# Patient Record
Sex: Male | Born: 1970 | Race: White | Hispanic: No | Marital: Single | Smoking: Current every day smoker
Health system: Southern US, Community
[De-identification: ages and names within clinical notes are randomized; demographics above are authoritative.]

## PROBLEM LIST (undated history)

## (undated) DIAGNOSIS — I2510 Atherosclerotic heart disease of native coronary artery without angina pectoris: Secondary | ICD-10-CM

## (undated) DIAGNOSIS — E785 Hyperlipidemia, unspecified: Secondary | ICD-10-CM

## (undated) DIAGNOSIS — Z955 Presence of coronary angioplasty implant and graft: Secondary | ICD-10-CM

## (undated) DIAGNOSIS — I255 Ischemic cardiomyopathy: Secondary | ICD-10-CM

## (undated) DIAGNOSIS — I10 Essential (primary) hypertension: Secondary | ICD-10-CM

## (undated) DIAGNOSIS — Z720 Tobacco use: Secondary | ICD-10-CM

## (undated) HISTORY — DX: Essential (primary) hypertension: I10

## (undated) HISTORY — DX: Tobacco use: Z72.0

## (undated) HISTORY — DX: Atherosclerotic heart disease of native coronary artery without angina pectoris: I25.10

## (undated) HISTORY — DX: Presence of coronary angioplasty implant and graft: Z95.5

## (undated) HISTORY — PX: HAND SURGERY: SHX662

## (undated) HISTORY — DX: Ischemic cardiomyopathy: I25.5

## (undated) HISTORY — DX: Hyperlipidemia, unspecified: E78.5

---

## 2002-01-23 ENCOUNTER — Emergency Department (HOSPITAL_COMMUNITY): Admission: EM | Admit: 2002-01-23 | Discharge: 2002-01-23 | Payer: Self-pay | Admitting: Emergency Medicine

## 2008-07-17 ENCOUNTER — Emergency Department (HOSPITAL_COMMUNITY): Admission: EM | Admit: 2008-07-17 | Discharge: 2008-07-17 | Payer: Self-pay | Admitting: Family Medicine

## 2008-11-20 ENCOUNTER — Ambulatory Visit (HOSPITAL_COMMUNITY): Admission: RE | Admit: 2008-11-20 | Discharge: 2008-11-20 | Payer: Self-pay | Admitting: Orthopedic Surgery
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
## 2010-05-04 LAB — CBC
MCHC: 34.3 g/dL (ref 30.0–36.0)
Platelets: 282 10*3/uL (ref 150–400)
RBC: 4.73 MIL/uL (ref 4.22–5.81)
RDW: 12 % (ref 11.5–15.5)

## 2018-05-13 ENCOUNTER — Other Ambulatory Visit: Payer: Self-pay

## 2018-05-13 ENCOUNTER — Emergency Department (HOSPITAL_COMMUNITY)
Admission: EM | Admit: 2018-05-13 | Discharge: 2018-05-13 | Disposition: A | Discharge status: Home / Self Care | Payer: Self-pay | Attending: Emergency Medicine | Admitting: Emergency Medicine

## 2018-05-13 ENCOUNTER — Encounter (HOSPITAL_COMMUNITY): Payer: Self-pay | Admitting: Emergency Medicine

## 2018-05-13 ENCOUNTER — Emergency Department (HOSPITAL_COMMUNITY): Payer: Self-pay

## 2018-05-13 DIAGNOSIS — J069 Acute upper respiratory infection, unspecified: Secondary | ICD-10-CM | POA: Insufficient documentation

## 2018-05-13 DIAGNOSIS — F17200 Nicotine dependence, unspecified, uncomplicated: Secondary | ICD-10-CM | POA: Insufficient documentation

## 2018-05-13 NOTE — ED Provider Notes (Signed)
[facility] EMERGENCY DEPARTMENT Provider Note   CSN: [id number] Arrival date & time: 05/13/18  0735    History   Chief Complaint Chief Complaint  Patient presents with  . Cough    HPI Haywood Lassohillip D Wolaver is a 48 y.o. male.     HPI Patient states he has had subjective fever and sweats starting last night.  Has had a cough for the last 2 days.  Is nonproductive.  Endorses generalized fatigue.  No difficulty breathing or chest pain.  No known COVID contacts.  His girlfriend is a Engineer, civil (consulting)nurse but she is asymptomatic.  No recent travel. History reviewed. No pertinent past medical history.  There are no active problems to display for this patient.   Past Surgical History:  Procedure Laterality Date  . HAND SURGERY          Home Medications    Prior to Admission medications   Not on File    Family History History reviewed. No pertinent family history.  Social History Social History   Tobacco Use  . Smoking status: Current Every Day Smoker    Packs/day: 1.00  . Smokeless tobacco: Never Used  Substance Use Topics  . Alcohol use: Never    Frequency: Never  . Drug use: Never     Allergies   Patient has no allergy information on record.   Review of Systems Review of Systems  Constitutional: Positive for diaphoresis and fever.  HENT: Negative for congestion and sore throat.   Respiratory: Positive for cough.   Gastrointestinal: Negative for diarrhea, nausea and vomiting.  Musculoskeletal: Positive for myalgias. Negative for neck pain and neck stiffness.  Skin: Negative for rash.  Neurological: Negative for dizziness, weakness, light-headedness, numbness and headaches.  All other systems reviewed and are negative.    Physical Exam Updated Vital Signs BP (!) 140/108   Pulse 96   Temp 98.3 F (36.8 C)   Resp 17   Ht 5\' 7"  (1.702 m)   SpO2 97%   Physical Exam Vitals signs and nursing note reviewed.  Constitutional:      General: He is not in acute  distress.    Appearance: Normal appearance. He is well-developed. He is not ill-appearing.  HENT:     Head: Normocephalic and atraumatic.  Eyes:     Pupils: Pupils are equal, round, and reactive to light.  Neck:     Musculoskeletal: Normal range of motion and neck supple.  Cardiovascular:     Rate and Rhythm: Normal rate.  Pulmonary:     Effort: Pulmonary effort is normal.     Breath sounds: Normal breath sounds.  Abdominal:     Palpations: Abdomen is soft.  Musculoskeletal: Normal range of motion.        General: No tenderness.  Skin:    General: Skin is warm and dry.     Findings: No erythema or rash.  Neurological:     General: No focal deficit present.     Mental Status: He is alert and oriented to person, place, and time.  Psychiatric:        Behavior: Behavior normal.      ED Treatments / Results  Labs (all labs ordered are listed, but only abnormal results are displayed) Labs Reviewed - No data to display  EKG None  [facility]  Result Date: 05/13/2018 CLINICAL DATA:  48 year old male with cough and fever EXAM: PORTABLE CHEST 1 VIEW COMPARISON:  None. FINDINGS: Cardiomediastinal silhouette within normal  limits in size and contour. Low lung volumes. No confluent airspace disease, pneumothorax, or pleural effusion. No displaced fracture IMPRESSION: Negative for acute cardiopulmonary disease Electronically Signed   By: Gilmer Mor D.O.   On: 05/13/2018 08:30    Procedures Procedures (including critical care time)  Medications Ordered in ED Medications - No data to display   Initial Impression / Assessment and Plan / ED Course  I have reviewed the triage vital signs and the nursing notes.  Pertinent labs & imaging results that were available during my care of the patient were reviewed by me and considered in my medical decision making (see chart for details).       ARTHUR MICKLEY was evaluated in Emergency Department on 05/13/2018 for  the symptoms described in the history of present illness. He was evaluated in the context of the global COVID-19 pandemic, which necessitated consideration that the patient might be at risk for infection with the SARS-CoV-2 virus that causes COVID-19. Institutional protocols and algorithms that pertain to the evaluation of patients at risk for COVID-19 are in a state of rapid change based on information released by regulatory bodies including the CDC and federal and state organizations. These policies and algorithms were followed during the patient's care in the ED.  Patient is very well-appearing.  No respiratory difficulty.  He has normal vital signs.  He is afebrile.  Chest x-ray without evidence of pneumonia.  Have advised self quarantine at home.  Understands the need to return for worsening of his symptoms especially difficulty breathing.  Final Clinical Impressions(s) / ED Diagnoses   Final diagnoses:  Viral upper respiratory tract infection    ED Discharge Orders    None       Loren Racer, MD 05/13/18 [phone]

## 2018-05-13 NOTE — ED Notes (Signed)
Obtained signed quarantine paper and gave quarantine instructions to patient. Patient verbalized understanding.

## 2018-05-13 NOTE — ED Triage Notes (Addendum)
Pt states having cough and fever since last night. Unknown of how high fever was.  Pt states his girlfriend is a Engineer, civil (consulting) at [employer] and did surgery on a "covid patient" and wants to be tested

## 2018-05-13 NOTE — Discharge Instructions (Addendum)
° ° °  Person Under Monitoring Name: Tanner Mcmillan  Location: [address] [place] [address]     Stay home except to get medical care You should restrict activities outside your home, except for getting medical care. Do not go to work, school, or public areas, and do not use public transportation or taxis.  Call ahead before visiting your doctor Before your medical appointment, call the healthcare provider and tell them that you have, or are being evaluated for, COVID-19 infection. This will help the healthcare providers office take steps to keep other people from getting infected. Ask your healthcare provider to call the local or state health department.  Monitor your symptoms Seek prompt medical attention if your illness is worsening (e.g., difficulty breathing). Before going to your medical appointment, call the healthcare provider and tell them that you have, or are being evaluated for, COVID-19 infection. Ask your healthcare provider to call the local or state health department.  Wear a facemask You should wear a facemask that covers your nose and mouth when you are in the same room with other people and when you visit a healthcare provider. People who live with or visit you should also wear a facemask while they are in the same room with you.  Separate yourself from other people in your home As much as possible, you should stay in a different room from other people in your home. Also, you should use a separate bathroom, if available.  Avoid sharing household items You should not share dishes, drinking glasses, cups, eating utensils, towels, bedding, or other items with other people in your home. After using these items, you should wash them thoroughly with soap and water.  Cover your coughs and sneezes Cover your mouth and nose with a tissue when you cough or sneeze, or you can cough or sneeze into your sleeve. Throw used tissues in a lined trash can, and  immediately wash your hands with soap and water for at least 20 seconds or use an alcohol-based hand rub.  Wash your [employer] your hands often and thoroughly with soap and water for at least 20 seconds. You can use an alcohol-based hand sanitizer if soap and water are not available and if your hands are not visibly dirty. Avoid touching your eyes, nose, and mouth with unwashed hands.

## 2023-08-03 ENCOUNTER — Other Ambulatory Visit: Payer: Self-pay

## 2023-08-03 ENCOUNTER — Emergency Department (HOSPITAL_COMMUNITY): Admit: 2023-08-03 | Payer: Self-pay | Admitting: Cardiovascular Disease

## 2023-08-03 ENCOUNTER — Encounter (HOSPITAL_COMMUNITY)
Admission: EM | Disposition: A | Discharge status: Home / Self Care | Payer: Self-pay | Source: Home / Self Care | Attending: Cardiovascular Disease

## 2023-08-03 ENCOUNTER — Emergency Department (HOSPITAL_COMMUNITY): Payer: Self-pay

## 2023-08-03 ENCOUNTER — Inpatient Hospital Stay (HOSPITAL_COMMUNITY): Payer: Self-pay

## 2023-08-03 ENCOUNTER — Inpatient Hospital Stay (HOSPITAL_COMMUNITY)
Admission: EM | Admit: 2023-08-03 | Discharge: 2023-08-05 | DRG: 321 | Disposition: A | Discharge status: Home / Self Care | Payer: Self-pay | Attending: Cardiovascular Disease | Admitting: Cardiovascular Disease

## 2023-08-03 DIAGNOSIS — Z888 Allergy status to other drugs, medicaments and biological substances status: Secondary | ICD-10-CM | POA: Diagnosis not present

## 2023-08-03 DIAGNOSIS — I10 Essential (primary) hypertension: Secondary | ICD-10-CM | POA: Diagnosis not present

## 2023-08-03 DIAGNOSIS — I2510 Atherosclerotic heart disease of native coronary artery without angina pectoris: Secondary | ICD-10-CM

## 2023-08-03 DIAGNOSIS — Z955 Presence of coronary angioplasty implant and graft: Secondary | ICD-10-CM

## 2023-08-03 DIAGNOSIS — I213 ST elevation (STEMI) myocardial infarction of unspecified site: Secondary | ICD-10-CM | POA: Diagnosis not present

## 2023-08-03 DIAGNOSIS — J9811 Atelectasis: Secondary | ICD-10-CM | POA: Diagnosis not present

## 2023-08-03 DIAGNOSIS — R000 Tachycardia, unspecified: Secondary | ICD-10-CM | POA: Diagnosis not present

## 2023-08-03 DIAGNOSIS — Z79899 Other long term (current) drug therapy: Secondary | ICD-10-CM | POA: Diagnosis not present

## 2023-08-03 DIAGNOSIS — R079 Chest pain, unspecified: Secondary | ICD-10-CM | POA: Diagnosis not present

## 2023-08-03 DIAGNOSIS — F17210 Nicotine dependence, cigarettes, uncomplicated: Secondary | ICD-10-CM | POA: Diagnosis not present

## 2023-08-03 DIAGNOSIS — I2109 ST elevation (STEMI) myocardial infarction involving other coronary artery of anterior wall: Principal | ICD-10-CM

## 2023-08-03 DIAGNOSIS — I2111 ST elevation (STEMI) myocardial infarction involving right coronary artery: Secondary | ICD-10-CM | POA: Diagnosis not present

## 2023-08-03 DIAGNOSIS — I255 Ischemic cardiomyopathy: Secondary | ICD-10-CM | POA: Diagnosis not present

## 2023-08-03 HISTORY — PX: LEFT HEART CATH AND CORONARY ANGIOGRAPHY: CATH118249

## 2023-08-03 HISTORY — PX: CORONARY/GRAFT ACUTE MI REVASCULARIZATION: CATH118305

## 2023-08-03 LAB — POCT I-STAT, CHEM 8
BUN: 9 mg/dL (ref 6–20)
Calcium, Ion: 1.08 mmol/L — ABNORMAL LOW (ref 1.15–1.40)
Chloride: 105 mmol/L (ref 98–111)
Creatinine, Ser: 0.7 mg/dL (ref 0.61–1.24)
Glucose, Bld: 114 mg/dL — ABNORMAL HIGH (ref 70–99)
HCT: 42 % (ref 39.0–52.0)
Hemoglobin: 14.3 g/dL (ref 13.0–17.0)
Potassium: 3.3 mmol/L — ABNORMAL LOW (ref 3.5–5.1)
Sodium: 140 mmol/L (ref 135–145)
TCO2: 21 mmol/L — ABNORMAL LOW (ref 22–32)

## 2023-08-03 LAB — LIPID PANEL
Cholesterol: 192 mg/dL (ref 0–200)
HDL: 32 mg/dL — ABNORMAL LOW (ref >40)
LDL Cholesterol: 129 mg/dL — ABNORMAL HIGH (ref 0–99)
Total CHOL/HDL Ratio: 6 ratio
Triglycerides: 156 mg/dL — ABNORMAL HIGH (ref <150)
VLDL: 31 mg/dL (ref 0–40)

## 2023-08-03 LAB — CBC WITH DIFFERENTIAL/PLATELET
Abs Immature Granulocytes: 0.03 K/uL (ref 0.00–0.07)
Basophils Absolute: 0.1 K/uL (ref 0.0–0.1)
Basophils Relative: 1 %
Eosinophils Absolute: 0.3 K/uL (ref 0.0–0.5)
Eosinophils Relative: 2 %
HCT: 46.6 % (ref 39.0–52.0)
Hemoglobin: 15.6 g/dL (ref 13.0–17.0)
Immature Granulocytes: 0 %
Lymphocytes Relative: 19 %
Lymphs Abs: 2.4 K/uL (ref 0.7–4.0)
MCH: 31.9 pg (ref 26.0–34.0)
MCHC: 33.5 g/dL (ref 30.0–36.0)
MCV: 95.3 fL (ref 80.0–100.0)
Monocytes Absolute: 0.8 K/uL (ref 0.1–1.0)
Monocytes Relative: 6 %
Neutro Abs: 9.3 K/uL — ABNORMAL HIGH (ref 1.7–7.7)
Neutrophils Relative %: 72 %
Platelets: 340 K/uL (ref 150–400)
RBC: 4.89 MIL/uL (ref 4.22–5.81)
RDW: 13.2 % (ref 11.5–15.5)
WBC: 12.9 K/uL — ABNORMAL HIGH (ref 4.0–10.5)
nRBC: 0 % (ref 0.0–0.2)

## 2023-08-03 LAB — COMPREHENSIVE METABOLIC PANEL WITH GFR
ALT: 23 U/L (ref 0–44)
AST: 19 U/L (ref 15–41)
Albumin: 3.6 g/dL (ref 3.5–5.0)
Alkaline Phosphatase: 76 U/L (ref 38–126)
Anion gap: 14 (ref 5–15)
BUN: 10 mg/dL (ref 6–20)
CO2: 21 mmol/L — ABNORMAL LOW (ref 22–32)
Calcium: 8.8 mg/dL — ABNORMAL LOW (ref 8.9–10.3)
Chloride: 103 mmol/L (ref 98–111)
Creatinine, Ser: 0.84 mg/dL (ref 0.61–1.24)
GFR, Estimated: >60 mL/min (ref >60)
Glucose, Bld: 173 mg/dL — ABNORMAL HIGH (ref 70–99)
Potassium: 3.7 mmol/L (ref 3.5–5.1)
Sodium: 138 mmol/L (ref 135–145)
Total Bilirubin: <0.2 mg/dL (ref 0.0–1.2)
Total Protein: 6.5 g/dL (ref 6.5–8.1)

## 2023-08-03 LAB — BASIC METABOLIC PANEL WITH GFR
Anion gap: 11 (ref 5–15)
BUN: 8 mg/dL (ref 6–20)
CO2: 23 mmol/L (ref 22–32)
Calcium: 9.7 mg/dL (ref 8.9–10.3)
Chloride: 105 mmol/L (ref 98–111)
Creatinine, Ser: 0.71 mg/dL (ref 0.61–1.24)
GFR, Estimated: >60 mL/min (ref >60)
Glucose, Bld: 91 mg/dL (ref 70–99)
Potassium: 4.3 mmol/L (ref 3.5–5.1)
Sodium: 139 mmol/L (ref 135–145)

## 2023-08-03 LAB — POCT ACTIVATED CLOTTING TIME
Activated Clotting Time: 256 s
Activated Clotting Time: 435 s

## 2023-08-03 LAB — SURGICAL PCR SCREEN
MRSA, PCR: NEGATIVE
Staphylococcus aureus: NEGATIVE

## 2023-08-03 LAB — ECHOCARDIOGRAM COMPLETE
Area-P 1/2: 3.91 cm2
Height: 66 in
S' Lateral: 3.75 cm
Weight: 2720 [oz_av]

## 2023-08-03 LAB — MAGNESIUM: Magnesium: 2 mg/dL (ref 1.7–2.4)

## 2023-08-03 LAB — APTT: aPTT: 35 s (ref 24–36)

## 2023-08-03 LAB — PROTIME-INR
INR: 0.9 (ref 0.8–1.2)
Prothrombin Time: 13.2 s (ref 11.4–15.2)

## 2023-08-03 LAB — GLUCOSE, CAPILLARY
Glucose-Capillary: 112 mg/dL — ABNORMAL HIGH (ref 70–99)
Glucose-Capillary: 95 mg/dL (ref 70–99)

## 2023-08-03 LAB — I-STAT CG4 LACTIC ACID, ED: Lactic Acid, Venous: 2 mmol/L (ref 0.5–1.9)

## 2023-08-03 LAB — HEMOGLOBIN A1C
Hgb A1c MFr Bld: 6.2 % — ABNORMAL HIGH (ref 4.8–5.6)
Mean Plasma Glucose: 131.24 mg/dL

## 2023-08-03 LAB — TROPONIN I (HIGH SENSITIVITY)
Troponin I (High Sensitivity): 25 ng/L — ABNORMAL HIGH (ref <18)
Troponin I (High Sensitivity): 3839 ng/L (ref <18)

## 2023-08-03 SURGERY — CORONARY/GRAFT ACUTE MI REVASCULARIZATION
Anesthesia: Moderate Sedation

## 2023-08-03 MED ORDER — POTASSIUM CHLORIDE CRYS ER 20 MEQ PO TBCR
20.0000 meq | EXTENDED_RELEASE_TABLET | Freq: Once | ORAL | Status: AC
  Administered 2023-08-03: 20 meq via ORAL
  Filled 2023-08-03: qty 1

## 2023-08-03 MED ORDER — VERAPAMIL HCL 2.5 MG/ML IV SOLN
INTRAVENOUS | Status: DC | PRN
  Administered 2023-08-03: 10 mL via INTRA_ARTERIAL

## 2023-08-03 MED ORDER — ACETAMINOPHEN 325 MG PO TABS
650.0000 mg | ORAL_TABLET | ORAL | Status: DC | PRN
  Administered 2023-08-04 (×2): 650 mg via ORAL
  Filled 2023-08-03 (×2): qty 2

## 2023-08-03 MED ORDER — SODIUM CHLORIDE 0.9 % IV SOLN: INTRAVENOUS | Status: AC

## 2023-08-03 MED ORDER — SODIUM CHLORIDE 0.9 % IV SOLN
250.0000 mL | INTRAVENOUS | Status: AC | PRN
Start: 2023-08-03 — End: 2023-08-04

## 2023-08-03 MED ORDER — ATORVASTATIN CALCIUM 80 MG PO TABS
80.0000 mg | ORAL_TABLET | Freq: Every day | ORAL | Status: DC
  Administered 2023-08-03 – 2023-08-05 (×3): 80 mg via ORAL
  Filled 2023-08-03 (×3): qty 1

## 2023-08-03 MED ORDER — SODIUM CHLORIDE 0.9% FLUSH
3.0000 mL | Freq: Two times a day (BID) | INTRAVENOUS | Status: DC
  Administered 2023-08-03 – 2023-08-05 (×4): 3 mL via INTRAVENOUS

## 2023-08-03 MED ORDER — ONDANSETRON HCL 4 MG/2ML IJ SOLN: 4.0000 mg | Freq: Four times a day (QID) | INTRAMUSCULAR | Status: DC | PRN

## 2023-08-03 MED ORDER — MUPIROCIN 2 % EX OINT
1.0000 | TOPICAL_OINTMENT | Freq: Two times a day (BID) | CUTANEOUS | Status: DC
  Administered 2023-08-03 (×2): 1 via NASAL
  Filled 2023-08-03: qty 22

## 2023-08-03 MED ORDER — LIDOCAINE HCL (PF) 1 % IJ SOLN
INTRAMUSCULAR | Status: DC | PRN
  Administered 2023-08-03: 2 mL

## 2023-08-03 MED ORDER — MIDAZOLAM HCL 2 MG/2ML IJ SOLN
INTRAMUSCULAR | Status: AC
Start: 2023-08-03 — End: 2023-08-03
  Filled 2023-08-03: qty 2

## 2023-08-03 MED ORDER — OXYCODONE HCL 5 MG PO TABS: 5.0000 mg | ORAL_TABLET | ORAL | Status: DC | PRN

## 2023-08-03 MED ORDER — HEPARIN (PORCINE) IN NACL 1000-0.9 UT/500ML-% IV SOLN
INTRAVENOUS | Status: DC | PRN
  Administered 2023-08-03: 500 mL

## 2023-08-03 MED ORDER — TICAGRELOR 90 MG PO TABS
ORAL_TABLET | ORAL | Status: DC | PRN
  Administered 2023-08-03: 180 mg via ORAL

## 2023-08-03 MED ORDER — SODIUM CHLORIDE 0.9% FLUSH
3.0000 mL | INTRAVENOUS | Status: DC | PRN
Start: 2023-08-03 — End: 2023-08-06

## 2023-08-03 MED ORDER — LABETALOL HCL 5 MG/ML IV SOLN: 10.0000 mg | INTRAVENOUS | Status: AC | PRN

## 2023-08-03 MED ORDER — ASPIRIN 81 MG PO CHEW
324.0000 mg | CHEWABLE_TABLET | Freq: Once | ORAL | Status: DC
  Filled 2023-08-03: qty 4

## 2023-08-03 MED ORDER — TIROFIBAN HCL IN NACL 5-0.9 MG/100ML-% IV SOLN: 0.1500 ug/kg/min | INTRAVENOUS | Status: AC

## 2023-08-03 MED ORDER — HEPARIN SODIUM (PORCINE) 1000 UNIT/ML IJ SOLN
INTRAMUSCULAR | Status: AC
  Filled 2023-08-03: qty 10

## 2023-08-03 MED ORDER — TIROFIBAN (AGGRASTAT) BOLUS VIA INFUSION
INTRAVENOUS | Status: DC | PRN
  Administered 2023-08-03: 1927.5 ug via INTRAVENOUS

## 2023-08-03 MED ORDER — TICAGRELOR 90 MG PO TABS
90.0000 mg | ORAL_TABLET | Freq: Two times a day (BID) | ORAL | Status: DC
  Administered 2023-08-03 – 2023-08-05 (×4): 90 mg via ORAL
  Filled 2023-08-03 (×4): qty 1

## 2023-08-03 MED ORDER — MORPHINE SULFATE (PF) 2 MG/ML IV SOLN: 2.0000 mg | INTRAVENOUS | Status: DC | PRN

## 2023-08-03 MED ORDER — ORAL CARE MOUTH RINSE: 15.0000 mL | OROMUCOSAL | Status: DC | PRN

## 2023-08-03 MED ORDER — VERAPAMIL HCL 2.5 MG/ML IV SOLN
INTRAVENOUS | Status: AC
Start: 2023-08-03 — End: 2023-08-03
  Filled 2023-08-03: qty 2

## 2023-08-03 MED ORDER — HYDRALAZINE HCL 20 MG/ML IJ SOLN
10.0000 mg | INTRAMUSCULAR | Status: AC | PRN
Start: 2023-08-03 — End: 2023-08-03

## 2023-08-03 MED ORDER — LIDOCAINE HCL (PF) 1 % IJ SOLN
INTRAMUSCULAR | Status: AC
  Filled 2023-08-03: qty 30

## 2023-08-03 MED ORDER — TIROFIBAN HCL IN NACL 5-0.9 MG/100ML-% IV SOLN
INTRAVENOUS | Status: AC | PRN
  Administered 2023-08-03: .15 ug/kg/min via INTRAVENOUS

## 2023-08-03 MED ORDER — FENTANYL CITRATE (PF) 100 MCG/2ML IJ SOLN
INTRAMUSCULAR | Status: DC | PRN
  Administered 2023-08-03: 25 ug via INTRAVENOUS
  Administered 2023-08-03: 50 ug via INTRAVENOUS

## 2023-08-03 MED ORDER — MIDAZOLAM HCL 2 MG/2ML IJ SOLN
INTRAMUSCULAR | Status: DC | PRN
  Administered 2023-08-03 (×2): 1 mg via INTRAVENOUS

## 2023-08-03 MED ORDER — HEPARIN SODIUM (PORCINE) 5000 UNIT/ML IJ SOLN
INTRAMUSCULAR | Status: AC
  Filled 2023-08-03: qty 1

## 2023-08-03 MED ORDER — ASPIRIN 81 MG PO CHEW
81.0000 mg | CHEWABLE_TABLET | Freq: Every day | ORAL | Status: DC
  Administered 2023-08-04 – 2023-08-05 (×2): 81 mg via ORAL
  Filled 2023-08-03 (×2): qty 1

## 2023-08-03 MED ORDER — CHLORHEXIDINE GLUCONATE CLOTH 2 % EX PADS
6.0000 | MEDICATED_PAD | Freq: Every day | CUTANEOUS | Status: DC
  Administered 2023-08-04 – 2023-08-05 (×2): 6 via TOPICAL

## 2023-08-03 MED ORDER — HEPARIN SODIUM (PORCINE) 5000 UNIT/ML IJ SOLN
4000.0000 [IU] | Freq: Once | INTRAMUSCULAR | Status: AC
  Administered 2023-08-03: 4000 [IU] via INTRAVENOUS

## 2023-08-03 MED ORDER — HEPARIN SODIUM (PORCINE) 1000 UNIT/ML IJ SOLN
INTRAMUSCULAR | Status: DC | PRN
  Administered 2023-08-03: 3000 [IU] via INTRAVENOUS
  Administered 2023-08-03: 6000 [IU] via INTRAVENOUS

## 2023-08-03 MED ORDER — NICOTINE 14 MG/24HR TD PT24
14.0000 mg | MEDICATED_PATCH | Freq: Every day | TRANSDERMAL | Status: DC
  Administered 2023-08-03 – 2023-08-04 (×2): 14 mg via TRANSDERMAL
  Filled 2023-08-03 (×2): qty 1

## 2023-08-03 MED ORDER — FENTANYL CITRATE (PF) 100 MCG/2ML IJ SOLN
INTRAMUSCULAR | Status: AC
Start: 2023-08-03 — End: 2023-08-03
  Filled 2023-08-03: qty 2

## 2023-08-03 SURGICAL SUPPLY — 15 items
BALLOON SAPPHIRE 2.5X12 (BALLOONS) IMPLANT
BALLOON ~~LOC~~ EMERGE MR 3.0X20 (BALLOONS) IMPLANT
BALLOON ~~LOC~~ EMERGE MR 3.5X15 (BALLOONS) IMPLANT
CATH 5FR JL3.5 JR4 ANG PIG MP (CATHETERS) IMPLANT
CATH VISTA GUIDE 6FR JR4 ECOPK (CATHETERS) IMPLANT
DEVICE RAD COMP TR BAND LRG (VASCULAR PRODUCTS) IMPLANT
GLIDESHEATH SLEND SS 6F .021 (SHEATH) IMPLANT
GUIDEWIRE INQWIRE 1.5J.035X260 (WIRE) IMPLANT
KIT ENCORE 26 ADVANTAGE (KITS) IMPLANT
PACK CARDIAC CATHETERIZATION (CUSTOM PROCEDURE TRAY) ×1 IMPLANT
SET ATX-X65L (MISCELLANEOUS) IMPLANT
STATION PROTECTION PRESSURIZED (MISCELLANEOUS) IMPLANT
STENT SYNERGY XD 2.75X24 (Permanent Stent) IMPLANT
STENT SYNERGY XD 3.0X24 (Permanent Stent) IMPLANT
WIRE ASAHI PROWATER 180CM (WIRE) IMPLANT

## 2023-08-03 NOTE — ED Triage Notes (Addendum)
 Pt bib EMS from home for chest pain beginning around 9:30. Pt was washing dishing when pain started. Ems gave 1 nitroglycerin  and 324 of aspirin  with relief, 2L O2. Hx of smoking. EMS activated Code Stemi. ST elevation noted on EKG  400 NS bolus given with EMS 91% Room Air

## 2023-08-03 NOTE — ED Provider Notes (Signed)
 [facility] CARDIAC CATH LAB Provider Note   CSN: [id number] Arrival date & time: 08/03/23  1054     Patient presents with: Chest Pain   Tanner Mcmillan is a 53 y.o. male.   Pt is a 53 yo male with pmhx significant for tobacco abuse.  He has not been to the doctor in years.  Pt developed cp today around 0930 while washing dishes.  Pt does have ST elevation on EKG, so EMS activated a code stemi en route.  Pt was given asa and nitroglycerin  en route.  Pt still has CP.       Prior to Admission medications   Not on File    Allergies: Allegra-d allergy & congestion [fexofenadine-pseudoephed er]    Review of Systems  Cardiovascular:  Positive for chest pain.  All other systems reviewed and are negative.   Updated Vital Signs BP (!) 150/84   Temp 98.3 F (36.8 C) (Oral)   Resp (!) 21   Ht 5' 6 (1.676 m)   Wt 77.1 kg   SpO2 100%   BMI 27.44 kg/m   Physical Exam Vitals and nursing note reviewed.  Constitutional:      Appearance: He is well-developed.  HENT:     Head: Normocephalic and atraumatic.  Eyes:     Extraocular Movements: Extraocular movements intact.     Pupils: Pupils are equal, round, and reactive to light.  Cardiovascular:     Rate and Rhythm: Normal rate and regular rhythm.     Heart sounds: Normal heart sounds.  Pulmonary:     Effort: Pulmonary effort is normal.     Breath sounds: Normal breath sounds.  Abdominal:     General: Bowel sounds are normal.     Palpations: Abdomen is soft.  Musculoskeletal:        General: Normal range of motion.     Cervical back: Normal range of motion and neck supple.  Skin:    General: Skin is warm.     Capillary Refill: Capillary refill takes less than 2 seconds.  Neurological:     General: No focal deficit present.     Mental Status: He is alert and oriented to person, place, and time.  Psychiatric:        Mood and Affect: Mood normal.        Behavior: Behavior normal.     (all labs  ordered are listed, but only abnormal results are displayed) Labs Reviewed  CBC WITH DIFFERENTIAL/PLATELET - Abnormal; Notable for the following components:      Result Value   WBC 12.9 (*)    Neutro Abs 9.3 (*)    All other components within normal limits  I-STAT CG4 LACTIC ACID, ED - Abnormal; Notable for the following components:   Lactic Acid, Venous 2.0 (*)    All other components within normal limits  HEMOGLOBIN A1C  PROTIME-INR  APTT  COMPREHENSIVE METABOLIC PANEL WITH GFR  LIPID PANEL  TROPONIN I (HIGH SENSITIVITY)    EKG: EKG Interpretation Date/Time:  Saturday August 03 2023 10:57:14 EDT Ventricular Rate:  103 PR Interval:  147 QRS Duration:  113 QT Interval:  342 QTC Calculation: 448 R Axis:   37  Text Interpretation: Sinus tachycardia Probable left atrial enlargement Incomplete right bundle branch block Lateral infarct, acute No old tracing to compare Confirmed by Dean Clarity [phone]) on 08/03/2023 11:28:41 AM  Radiology: No results found.   Procedures   Medications Ordered in the ED  0.9 %  sodium chloride  infusion (has no administration in time range)  aspirin  chewable tablet 324 mg ( Oral MAR Hold 08/03/23 1113)  heparin  5000 UNIT/ML injection (has no administration in time range)  midazolam  (VERSED ) injection (1 mg Intravenous Given 08/03/23 1122)  fentaNYL  (SUBLIMAZE ) injection (25 mcg Intravenous Given 08/03/23 1122)  lidocaine  (PF) (XYLOCAINE ) 1 % injection (2 mLs Infiltration Given 08/03/23 1123)  Radial Cocktail/Verapamil  only (10 mLs Intra-arterial Given 08/03/23 1123)  Heparin  (Porcine) in NaCl 1000-0.9 UT/500ML-% SOLN (500 mLs  Given 08/03/23 1128)  heparin  sodium (porcine) injection (6,000 Units Intravenous Given 08/03/23 1123)  heparin  injection 4,000 Units (4,000 Units Intravenous Given 08/03/23 1106)                                    Medical Decision Making Amount and/or Complexity of Data Reviewed Labs: ordered.  Risk OTC drugs. Prescription drug  management.   This patient presents to the ED for concern of cp, this involves an extensive number of treatment options, and is a complaint that carries with it a high risk of complications and morbidity.  The differential diagnosis includes stemi, nstemi, pulm, gi   Co morbidities that complicate the patient evaluation  Tobacco abuse   Additional history obtained:  Additional history obtained from epic chart review External records from outside source obtained and reviewed including EMS report   Lab Tests:  I Ordered, and personally interpreted labs.  The pertinent results include:  cbc with wbc sl elevated at 12.9 (other labs pending at tx to cath lab)   Imaging Studies ordered:  I ordered a CXR, but pt went to the cath lab prior to it getting done.  Cardiac Monitoring:  The patient was maintained on a cardiac monitor.  I personally viewed and interpreted the cardiac monitored which showed an underlying rhythm of: nsr   Medicines ordered and prescription drug management:  I ordered medication including heparin   for stemi  Reevaluation of the patient after these medicines showed that the patient improved I have reviewed the patients home medicines and have made adjustments as needed   Critical Interventions:  Code stemi   Consultations Obtained:  I requested consultation with the cardiologist (Dr. Verlin),  and discussed lab and imaging findings as well as pertinent plan - he took pt to the cath lab   Problem List / ED Course:  STEMI:  pt given asa by EMS.  He is given heparin  in the ED.  He is transferred to the cath lab   Reevaluation:  After the interventions noted above, I reevaluated the patient and found that they have :improved   Social Determinants of Health:  Lives at home   Dispostion:  After consideration of the diagnostic results and the patients response to treatment, I feel that the patent would benefit from admission.       Final  diagnoses:  ST elevation myocardial infarction (STEMI), unspecified artery [facility])    ED Discharge Orders     None          Dean Clarity, MD 08/03/23 1129

## 2023-08-03 NOTE — Progress Notes (Signed)
*  PRELIMINARY RESULTS* Echocardiogram 2D Echocardiogram has been performed.  Tanner Mcmillan 08/03/2023, 2:37 PM

## 2023-08-03 NOTE — Progress Notes (Signed)
   08/03/23 1108  Spiritual Encounters  Type of Visit Initial  Reason for visit Code  OnCall Visit Yes   Chaplain responded to Code STEMI page. Per doctor, Pt is going to [facility] for treatment and his mother may be coming but that's uncertain. Chaplain asked ER nurse at head station to contact chaplain if mother arrives. No spiritual need at this time.  Chaplain Therisa Samuel

## 2023-08-03 NOTE — H&P (Signed)
 Cardiology Admission History and Physical   Patient ID: Tanner Mcmillan MRN: [id number]; DOB: 22-Dec-1970   Admission date: 08/03/2023  PCP:  Patient, No Pcp Per   [facility] Providers Cardiologist:  None   Chief Complaint:  Chest pain   History of Present Illness: Tanner Mcmillan  is a 53 yo male with history of tobacco abuse who is presenting via EMS with active chest pain that began at 9:30am. EKG with lateral ST elevation c/w acute MI. No prior cardiac issues. Has smoked 1 ppd for 35 years.    No past medical history on file. Past Surgical History:  Procedure Laterality Date   HAND SURGERY       Medications Prior to Admission: Prior to Admission medications   Not on File     Allergies:    Allergies  Allergen Reactions   Allegra-D Allergy & Congestion [Fexofenadine-Pseudoephed Er]     Social History:   Social History   Socioeconomic History   Marital status: Single    Spouse name: Not on file   Number of children: Not on file   Years of education: Not on file   Highest education level: Not on file  Occupational History   Not on file  Tobacco Use   Smoking status: Every Day    Current packs/day: 1.00    Types: Cigarettes   Smokeless tobacco: Never  Substance and Sexual Activity   Alcohol use: Never   Drug use: Never   Sexual activity: Not on file  Other Topics Concern   Not on file  Social History Narrative   Not on file   Social Drivers of Health   Financial Resource Strain: Not on file  Food Insecurity: Not on file  Transportation Needs: Not on file  Physical Activity: Not on file  Stress: Not on file  Social Connections: Not on file  Intimate Partner Violence: Not on file     Family History:   The patient's family history is not on file.   No CAD  ROS:  Please see the history of present illness.  All other ROS reviewed and negative.     Physical Exam/Data: Vitals:   08/03/23 1058 08/03/23 1101  BP: (!) 150/84   Resp: (!)  21   Temp: 98.3 F (36.8 C)   TempSrc: Oral   SpO2: 100%   Weight:  77.1 kg  Height:  5' 6 (1.676 m)   No intake or output data in the 24 hours ending 08/03/23 1107    08/03/2023   11:01 AM  Last 3 Weights  Weight (lbs) 170 lb  Weight (kg) 77.111 kg     Body mass index is 27.44 kg/m.  General:  Well nourished, well developed, in no acute distress HEENT: normal Neck: no JVD Vascular: No carotid bruits; Distal pulses 2+ bilaterally   Cardiac:  normal S1, S2; RRR; no murmur  Lungs:  clear to auscultation bilaterally, no wheezing, rhonchi or rales  Abd: soft, nontender, no hepatomegaly  Ext: no LE edema Musculoskeletal:  No deformities, BUE and BLE strength normal and equal Skin: warm and dry  Neuro:  CNs 2-12 intact, no focal abnormalities noted Psych:  Normal affect   EKG:  The ECG that was done was personally reviewed and demonstrates sinus with 1 mm elevation in lead 1, AVL and V2 with ST depression inferior leads  Relevant CV Studies:   Laboratory Data: High Sensitivity Troponin:  No results for input(s): TROPONINIHS in the last 720 hours.  ChemistryNo results for input(s): NA, K, CL, CO2, GLUCOSE, BUN, CREATININE, CALCIUM , MG, GFRNONAA, GFRAA, ANIONGAP in the last 168 hours.  No results for input(s): PROT, ALBUMIN, AST, ALT, ALKPHOS, BILITOT in the last 168 hours. Lipids No results for input(s): CHOL, TRIG, HDL, LABVLDL, LDLCALC, CHOLHDL in the last 168 hours. HematologyNo results for input(s): WBC, RBC, HGB, HCT, MCV, MCH, MCHC, RDW, PLT in the last 168 hours. Thyroid No results for input(s): TSH, FREET4 in the last 168 hours. BNPNo results for input(s): BNP, PROBNP in the last 168 hours.  DDimer No results for input(s): DDIMER in the last 168 hours.  Radiology/Studies:  No results found.   Assessment and Plan:  Acute anterolateral STEMI: EKG c/w acute MI. Plan emergent cardiac cath.    Code Status: Full Code  Severity of Illness: The appropriate patient status for this patient is INPATIENT. Inpatient status is judged to be reasonable and necessary in order to provide the required intensity of service to ensure the patient's safety. The patient's presenting symptoms, physical exam findings, and initial radiographic and laboratory data in the context of their chronic comorbidities is felt to place them at high risk for further clinical deterioration. Furthermore, it is not anticipated that the patient will be medically stable for discharge from the hospital within 2 midnights of admission.   * I certify that at the point of admission it is my clinical judgment that the patient will require inpatient hospital care spanning beyond 2 midnights from the point of admission due to high intensity of service, high risk for further deterioration and high frequency of surveillance required.*  For questions or updates, please contact [facility] Please consult [email or web address] for contact info under     Signed, Lonni Cash, MD  08/03/2023 11:07 AM

## 2023-08-04 DIAGNOSIS — I2109 ST elevation (STEMI) myocardial infarction involving other coronary artery of anterior wall: Principal | ICD-10-CM

## 2023-08-04 LAB — BASIC METABOLIC PANEL WITH GFR
Anion gap: 10 (ref 5–15)
BUN: 10 mg/dL (ref 6–20)
CO2: 26 mmol/L (ref 22–32)
Calcium: 9.5 mg/dL (ref 8.9–10.3)
Chloride: 103 mmol/L (ref 98–111)
Creatinine, Ser: 0.76 mg/dL (ref 0.61–1.24)
GFR, Estimated: >60 mL/min (ref >60)
Glucose, Bld: 111 mg/dL — ABNORMAL HIGH (ref 70–99)
Potassium: 4.2 mmol/L (ref 3.5–5.1)
Sodium: 139 mmol/L (ref 135–145)

## 2023-08-04 LAB — CBC
HCT: 48.2 % (ref 39.0–52.0)
Hemoglobin: 16.3 g/dL (ref 13.0–17.0)
MCH: 32.1 pg (ref 26.0–34.0)
MCHC: 33.8 g/dL (ref 30.0–36.0)
MCV: 95.1 fL (ref 80.0–100.0)
Platelets: 347 K/uL (ref 150–400)
RBC: 5.07 MIL/uL (ref 4.22–5.81)
RDW: 13.2 % (ref 11.5–15.5)
WBC: 12.6 K/uL — ABNORMAL HIGH (ref 4.0–10.5)
nRBC: 0 % (ref 0.0–0.2)

## 2023-08-04 LAB — GLUCOSE, CAPILLARY: Glucose-Capillary: 141 mg/dL — ABNORMAL HIGH (ref 70–99)

## 2023-08-04 MED ORDER — LOSARTAN POTASSIUM 25 MG PO TABS
25.0000 mg | ORAL_TABLET | Freq: Every day | ORAL | Status: DC
  Administered 2023-08-04 – 2023-08-05 (×2): 25 mg via ORAL
  Filled 2023-08-04 (×2): qty 1

## 2023-08-04 MED ORDER — ENOXAPARIN SODIUM 40 MG/0.4ML IJ SOSY
40.0000 mg | PREFILLED_SYRINGE | INTRAMUSCULAR | Status: DC
  Administered 2023-08-04: 40 mg via SUBCUTANEOUS
  Filled 2023-08-04: qty 0.4

## 2023-08-04 NOTE — Progress Notes (Addendum)
 Patient ID: Tanner Mcmillan, male   DOB: 26-Aug-1970, 53 y.o.   MRN: [id number]     Advanced Heart Failure Rounding Note  Cardiologist: None  Chief Complaint: STEMI Subjective:    No further chest pain.    Echo: EF 50-55%, moderate LVH, RV normal.    Objective:   Weight Range: 79.3 kg Body mass index is 28.22 kg/m.   Vital Signs:   Temp:  [97.7 F (36.5 C)-98.3 F (36.8 C)] 97.7 F (36.5 C) (07/06 0612) Pulse Rate:  [0-102] 93 (07/06 0800) Resp:  [11-24] 15 (07/06 0800) BP: (113-150)/(77-111) 129/77 (07/06 0800) SpO2:  [91 %-100 %] 92 % (07/06 0800) Weight:  [77.1 kg-79.3 kg] 79.3 kg (07/06 0500) Last BM Date : 08/03/23  Weight change: Filed Weights   08/03/23 1101 08/04/23 0500  Weight: 77.1 kg 79.3 kg    Intake/Output:   Intake/Output Summary (Last 24 hours) at 08/04/2023 0838 Last data filed at 08/04/2023 0800 Gross per 24 hour  Intake 1119.18 ml  Output 2800 ml  Net -1680.82 ml      Physical Exam    General:  Well appearing. No resp difficulty HEENT: Normal Neck: Supple. JVP . Carotids 2+ bilat; no bruits. No lymphadenopathy or thyromegaly appreciated. Cor: PMI nondisplaced. Regular rate & rhythm. No rubs, gallops or murmurs. Lungs: Clear Abdomen: Soft, nontender, nondistended. No hepatosplenomegaly. No bruits or masses. Good bowel sounds. Extremities: No cyanosis, clubbing, rash, edema Neuro: Alert & orientedx3, cranial nerves grossly intact. moves all 4 extremities w/o difficulty. Affect pleasant   Telemetry   NSR 90s-100s (personally reviewed)  EKG    NSR with lateral TWIs (personally reviewed)  Labs    CBC Recent Labs    08/03/23 1059 08/03/23 1135 08/04/23 0232  WBC 12.9*  --  12.6*  NEUTROABS 9.3*  --   --   HGB 15.6 14.3 16.3  HCT 46.6 42.0 48.2  MCV 95.3  --  95.1  PLT 340  --  347   Basic Metabolic Panel Recent Labs    92/94/74 1905 08/04/23 0232  NA 139 139  K 4.3 4.2  CL 105 103  CO2 23 26  GLUCOSE 91 111*  BUN 8  10  CREATININE 0.71 0.76  CALCIUM  9.7 9.5  MG 2.0  --    Liver Function Tests Recent Labs    08/03/23 1059  AST 19  ALT 23  ALKPHOS 76  BILITOT <0.2  PROT 6.5  ALBUMIN 3.6   No results for input(s): LIPASE, AMYLASE in the last 72 hours. Cardiac Enzymes No results for input(s): CKTOTAL, CKMB, CKMBINDEX, TROPONINI in the last 72 hours.  BNP: BNP (last 3 results) No results for input(s): BNP in the last 8760 hours.  ProBNP (last 3 results) No results for input(s): PROBNP in the last 8760 hours.   D-Dimer No results for input(s): DDIMER in the last 72 hours. Hemoglobin A1C Recent Labs    08/03/23 1059  HGBA1C 6.2*   Fasting Lipid Panel Recent Labs    08/03/23 1059  CHOL 192  HDL 32*  LDLCALC 129*  TRIG 156*  CHOLHDL 6.0   Thyroid Function Tests No results for input(s): TSH, T4TOTAL, T3FREE, THYROIDAB in the last 72 hours.  Invalid input(s): FREET3  Other results:   Imaging    ECHOCARDIOGRAM COMPLETE Result Date: 08/03/2023    ECHOCARDIOGRAM REPORT   Patient Name:   Tanner Mcmillan Date of Exam: 08/03/2023 Medical Rec #:  [id number]  Height:       66.0 in Accession #:    [id number]       Weight:       170.0 lb Date of Birth:  01-Mar-1970        BSA:          1.866 m Patient Age:    53 years         BP:           150/84 mmHg Patient Gender: M                HR:           80 bpm. Exam Location:  [facility] Procedure: 2D Echo, 3D Echo, Cardiac Doppler, Color Doppler and Strain Analysis            (Both Spectral and Color Flow Doppler were utilized during            procedure). Indications:    CAD Native Vessel l25.10  History:        Patient has no prior history of Echocardiogram examinations.                 This admission with acute NSTEMI.  Sonographer:    Aida Pizza RCS Referring Phys: [phone] [facility] D [facility]  Sonographer Comments: Global longitudinal strain was attempted. IMPRESSIONS  1. Left ventricular ejection fraction, by  estimation, is 50 to 55%. Left ventricular ejection fraction by 3D volume is 50 %. The left ventricle has low normal function. The left ventricle demonstrates regional wall motion abnormalities (see scoring diagram/findings for description). There is moderate concentric left ventricular hypertrophy. Left ventricular diastolic parameters are indeterminate.  2. Right ventricular systolic function is normal. The right ventricular size is normal. Tricuspid regurgitation signal is inadequate for assessing PA pressure.  3. The mitral valve is grossly normal. Trivial mitral valve regurgitation.  4. The aortic valve is tricuspid. Aortic valve regurgitation is not visualized.  5. The inferior vena cava is normal in size with greater than 50% respiratory variability, suggesting right atrial pressure of 3 mmHg. Comparison(s): No prior Echocardiogram. FINDINGS  Left Ventricle: Left ventricular ejection fraction, by estimation, is 50 to 55%. Left ventricular ejection fraction by 3D volume is 50 %. The left ventricle has low normal function. The left ventricle demonstrates regional wall motion abnormalities. The  left ventricular internal cavity size was normal in size. There is moderate concentric left ventricular hypertrophy. Left ventricular diastolic parameters are indeterminate.  LV Wall Scoring: The basal inferolateral segment, basal anterolateral segment, and basal inferior segment are hypokinetic. The entire anterior wall, mid and distal lateral wall, entire septum, entire apex, mid and distal inferior wall, and mid anterolateral segment are normal. Right Ventricle: The right ventricular size is normal. No increase in right ventricular wall thickness. Right ventricular systolic function is normal. Tricuspid regurgitation signal is inadequate for assessing PA pressure. Left Atrium: Left atrial size was normal in size. Right Atrium: Right atrial size was normal in size. Pericardium: Trivial pericardial effusion is present.  The pericardial effusion is posterior to the left ventricle. Mitral Valve: The mitral valve is grossly normal. Trivial mitral valve regurgitation. Tricuspid Valve: The tricuspid valve is grossly normal. Tricuspid valve regurgitation is trivial. Aortic Valve: The aortic valve is tricuspid. Aortic valve regurgitation is not visualized. Pulmonic Valve: The pulmonic valve was grossly normal. Pulmonic valve regurgitation is not visualized. Aorta: The aortic root is normal in size and structure. Venous: The inferior vena cava is normal in  size with greater than 50% respiratory variability, suggesting right atrial pressure of 3 mmHg. IAS/Shunts: No atrial level shunt detected by color flow Doppler. Additional Comments: 3D was performed not requiring image post processing on an independent workstation and was normal.  LEFT VENTRICLE PLAX 2D LVIDd:         5.15 cm         Diastology LVIDs:         3.75 cm         LV e' medial:    7.46 cm/s LV PW:         1.30 cm         LV E/e' medial:  11.1 LV IVS:        1.30 cm         LV e' lateral:   8.08 cm/s LVOT diam:     2.00 cm         LV E/e' lateral: 10.2 LV SV:         60 LV SV Index:   32 LVOT Area:     3.14 cm        3D Volume EF                                LV 3D EF:    Left                                             ventricul                                             ar                                             ejection                                             fraction                                             by 3D                                             volume is                                             50 %.                                 3D Volume EF:  3D EF:        50 %                                LV EDV:       157 ml                                LV ESV:       78 ml                                LV SV:        79 ml RIGHT VENTRICLE RV S prime:     10.40 cm/s TAPSE (M-mode): 1.6 cm LEFT ATRIUM             Index         RIGHT ATRIUM           Index LA diam:        3.90 cm 2.09 cm/m   RA Area:     11.10 cm LA Vol (A2C):   43.0 ml 23.04 ml/m  RA Volume:   25.00 ml  13.40 ml/m LA Vol (A4C):   44.8 ml 24.00 ml/m LA Biplane Vol: 44.5 ml 23.84 ml/m  AORTIC VALVE LVOT Vmax:   98.80 cm/s LVOT Vmean:  71.000 cm/s LVOT VTI:    0.190 m  AORTA Ao Root diam: 3.70 cm MITRAL VALVE MV Area (PHT): 3.91 cm    SHUNTS MV Decel Time: 194 msec    Systemic VTI:  0.19 m MV E velocity: 82.50 cm/s  Systemic Diam: 2.00 cm MV A velocity: 91.70 cm/s MV E/A ratio:  0.90 Jayson Sierras MD Electronically signed by Jayson Sierras MD Signature Date/Time: 08/03/2023/3:12:08 PM    Final    CARDIAC CATHETERIZATION Result Date: 08/03/2023   Mid RCA lesion is 99% stenosed.   Dist RCA lesion is 70% stenosed.   1st Mrg lesion is 40% stenosed.   Mid LAD lesion is 70% stenosed.   1st Diag lesion is 50% stenosed.   A drug-eluting stent was successfully placed using a STENT SYNERGY XD 3.0X24.   A drug-eluting stent was successfully placed using a STENT SYNERGY XD [id number].   Post intervention, there is a 0% residual stenosis.   Post intervention, there is a 0% residual stenosis. Acute anterolateral STEMI Successful PTCA/DES x 2 mid and distal RCA Severe stenosis mid LAD (70% eccentric). Segmental LV dysfunction with anteroapical hypokinesis. Recommendations: Tanner admit to the ICU. Aggrastat  infusion for 2 hours post cath. DAPT with ASA and Brilinta  for one year. High intensity statin. Echo this weekend. Tanner plan staged PCI of the mid LAD on Monday.     Medications:     Scheduled Medications:  aspirin   81 mg Oral Daily   atorvastatin   80 mg Oral Daily   Chlorhexidine  Gluconate Cloth  6 each Topical Daily   enoxaparin  (LOVENOX ) injection  40 mg Subcutaneous Q24H   losartan   25 mg Oral Daily   nicotine   14 mg Transdermal Daily   sodium chloride  flush  3 mL Intravenous Q12H   ticagrelor   90 mg Oral BID    Infusions:  sodium chloride  Stopped (08/04/23  0534)   sodium chloride       PRN Medications: sodium chloride , acetaminophen , morphine  injection, ondansetron  (ZOFRAN ) IV, mouth  rinse, oxyCODONE , sodium chloride  flush    Assessment/Plan   1. CAD: Patient admitted with anterolateral STEMI, cath showed 99% mid RCA stenosis (culprit) with 70% eccentric mid LAD stenosis.  He had DES x 2 to RCA with resolution of STE.  No further chest pain.   - He Tanner return to cath lab tomorrow to complete full revascularization => PCI to LAD.  - Continue ASA 81 and ticagrelor .  - Continue atorvastatin  80 mg daily.  2. Ischemic cardiomyopathy: Echo showed mildly decreased LV EF 50%, normal RV.  Not volume overloaded on exam, no dyspnea.  - Tanner add losartan  25 mg daily.  3. Active smoker: Strongly encouraged him to quit.   OK for telemetry, Team C tomorrow.   Length of Stay: 1  Ezra Shuck, MD  08/04/2023, 8:38 AM  Advanced Heart Failure Team Pager [phone] (M-F; 7a - 5p)  Please contact [facility] for night-coverage after hours (5p -7a ) and weekends on [email or web address]

## 2023-08-04 NOTE — Progress Notes (Signed)
 Patient arrived at the unit from 2H,upon arrival pt is alert and oriented X4,chg bath given,vitals checked,CCMD notified, no complaints of pain,pt oriented to the unit,call bell in reach

## 2023-08-05 ENCOUNTER — Other Ambulatory Visit (HOSPITAL_COMMUNITY): Payer: Self-pay

## 2023-08-05 ENCOUNTER — Encounter (HOSPITAL_COMMUNITY): Payer: Self-pay | Admitting: Cardiovascular Disease

## 2023-08-05 ENCOUNTER — Telehealth (HOSPITAL_COMMUNITY): Payer: Self-pay | Admitting: Pharmacy Technician

## 2023-08-05 ENCOUNTER — Telehealth: Payer: Self-pay | Admitting: Cardiology

## 2023-08-05 ENCOUNTER — Encounter (HOSPITAL_COMMUNITY)
Admission: EM | Disposition: A | Discharge status: Home / Self Care | Payer: Self-pay | Source: Home / Self Care | Attending: Cardiovascular Disease

## 2023-08-05 DIAGNOSIS — I2510 Atherosclerotic heart disease of native coronary artery without angina pectoris: Secondary | ICD-10-CM

## 2023-08-05 HISTORY — PX: LEFT HEART CATH AND CORONARY ANGIOGRAPHY: CATH118249

## 2023-08-05 HISTORY — PX: CORONARY PRESSURE/FFR STUDY: CATH118243

## 2023-08-05 HISTORY — PX: CORONARY STENT INTERVENTION: CATH118234

## 2023-08-05 LAB — CBC
HCT: 50.2 % (ref 39.0–52.0)
Hemoglobin: 16.4 g/dL (ref 13.0–17.0)
MCH: 31.6 pg (ref 26.0–34.0)
MCHC: 32.7 g/dL (ref 30.0–36.0)
MCV: 96.7 fL (ref 80.0–100.0)
Platelets: 349 K/uL (ref 150–400)
RBC: 5.19 MIL/uL (ref 4.22–5.81)
RDW: 12.9 % (ref 11.5–15.5)
WBC: 15.6 K/uL — ABNORMAL HIGH (ref 4.0–10.5)
nRBC: 0 % (ref 0.0–0.2)

## 2023-08-05 LAB — BASIC METABOLIC PANEL WITH GFR
Anion gap: 12 (ref 5–15)
BUN: 12 mg/dL (ref 6–20)
CO2: 28 mmol/L (ref 22–32)
Calcium: 9.5 mg/dL (ref 8.9–10.3)
Chloride: 99 mmol/L (ref 98–111)
Creatinine, Ser: 0.94 mg/dL (ref 0.61–1.24)
GFR, Estimated: >60 mL/min (ref >60)
Glucose, Bld: 100 mg/dL — ABNORMAL HIGH (ref 70–99)
Potassium: 3.8 mmol/L (ref 3.5–5.1)
Sodium: 139 mmol/L (ref 135–145)

## 2023-08-05 LAB — POCT ACTIVATED CLOTTING TIME
Activated Clotting Time: 274 s
Activated Clotting Time: 377 s
Activated Clotting Time: 273 s

## 2023-08-05 LAB — LIPOPROTEIN A (LPA): Lipoprotein (a): 78.4 nmol/L — ABNORMAL HIGH (ref <75.0)

## 2023-08-05 SURGERY — CORONARY STENT INTERVENTION
Anesthesia: LOCAL

## 2023-08-05 MED ORDER — NICOTINE 14 MG/24HR TD PT24
14.0000 mg | MEDICATED_PATCH | Freq: Every day | TRANSDERMAL | 0 refills | Status: DC
  Filled 2023-08-05: qty 21, 21d supply, fill #0
  Filled 2023-08-05: qty 28, 28d supply, fill #0
  Filled 2023-08-05: qty 21, 21d supply, fill #0

## 2023-08-05 MED ORDER — SODIUM CHLORIDE 0.9 % WEIGHT BASED INFUSION
1.0000 mL/kg/h | INTRAVENOUS | Status: DC
  Administered 2023-08-05: 1 mL/kg/h via INTRAVENOUS

## 2023-08-05 MED ORDER — SODIUM CHLORIDE 0.9 % WEIGHT BASED INFUSION: 1.0000 mL/kg/h | INTRAVENOUS | Status: DC

## 2023-08-05 MED ORDER — HEPARIN SODIUM (PORCINE) 1000 UNIT/ML IJ SOLN
INTRAMUSCULAR | Status: AC
  Filled 2023-08-05: qty 10

## 2023-08-05 MED ORDER — FENTANYL CITRATE (PF) 100 MCG/2ML IJ SOLN
INTRAMUSCULAR | Status: DC | PRN
  Administered 2023-08-05: 25 ug via INTRAVENOUS
  Administered 2023-08-05: 50 ug via INTRAVENOUS

## 2023-08-05 MED ORDER — ATORVASTATIN CALCIUM 80 MG PO TABS
80.0000 mg | ORAL_TABLET | Freq: Every day | ORAL | 3 refills | Status: DC
  Filled 2023-08-05: qty 30, 30d supply, fill #0

## 2023-08-05 MED ORDER — IOHEXOL 350 MG/ML SOLN
INTRAVENOUS | Status: DC | PRN
  Administered 2023-08-05: 160 mL

## 2023-08-05 MED ORDER — SODIUM CHLORIDE 0.9 % IV SOLN
INTRAVENOUS | Status: DC | PRN
  Administered 2023-08-05: 10 mL/h via INTRAVENOUS

## 2023-08-05 MED ORDER — LIDOCAINE HCL (PF) 1 % IJ SOLN
INTRAMUSCULAR | Status: DC | PRN
  Administered 2023-08-05: 2 mL via INTRADERMAL

## 2023-08-05 MED ORDER — SODIUM CHLORIDE 0.9% FLUSH: 3.0000 mL | Freq: Two times a day (BID) | INTRAVENOUS | Status: DC

## 2023-08-05 MED ORDER — ASPIRIN 81 MG PO CHEW
81.0000 mg | CHEWABLE_TABLET | Freq: Every day | ORAL | 3 refills | Status: AC
  Filled 2023-08-05: qty 30, 30d supply, fill #0

## 2023-08-05 MED ORDER — VERAPAMIL HCL 2.5 MG/ML IV SOLN
INTRAVENOUS | Status: DC | PRN
  Administered 2023-08-05: 10 mL via INTRA_ARTERIAL

## 2023-08-05 MED ORDER — MIDAZOLAM HCL 2 MG/2ML IJ SOLN
INTRAMUSCULAR | Status: DC | PRN
  Administered 2023-08-05 (×2): 1 mg via INTRAVENOUS

## 2023-08-05 MED ORDER — FENTANYL CITRATE (PF) 100 MCG/2ML IJ SOLN
INTRAMUSCULAR | Status: AC
Start: 2023-08-05 — End: 2023-08-05
  Filled 2023-08-05: qty 2

## 2023-08-05 MED ORDER — NITROGLYCERIN 1 MG/10 ML FOR IR/CATH LAB
INTRA_ARTERIAL | Status: AC
  Filled 2023-08-05: qty 10

## 2023-08-05 MED ORDER — SODIUM CHLORIDE 0.9 % IV SOLN: 250.0000 mL | INTRAVENOUS | Status: DC | PRN

## 2023-08-05 MED ORDER — NITROGLYCERIN 0.4 MG SL SUBL
0.4000 mg | SUBLINGUAL_TABLET | SUBLINGUAL | 1 refills | Status: DC | PRN
  Filled 2023-08-05: qty 25, 2d supply, fill #0

## 2023-08-05 MED ORDER — SODIUM CHLORIDE 0.9% FLUSH: 3.0000 mL | INTRAVENOUS | Status: DC | PRN

## 2023-08-05 MED ORDER — ASPIRIN 81 MG PO CHEW
81.0000 mg | CHEWABLE_TABLET | ORAL | Status: AC
  Administered 2023-08-05: 81 mg via ORAL
  Filled 2023-08-05: qty 1

## 2023-08-05 MED ORDER — NITROGLYCERIN 1 MG/10 ML FOR IR/CATH LAB
INTRA_ARTERIAL | Status: DC | PRN
  Administered 2023-08-05 (×2): 100 ug via INTRACORONARY

## 2023-08-05 MED ORDER — SODIUM CHLORIDE 0.9 % WEIGHT BASED INFUSION: 3.0000 mL/kg/h | INTRAVENOUS | Status: DC

## 2023-08-05 MED ORDER — VERAPAMIL HCL 2.5 MG/ML IV SOLN
INTRAVENOUS | Status: AC
  Filled 2023-08-05: qty 2

## 2023-08-05 MED ORDER — HYDRALAZINE HCL 20 MG/ML IJ SOLN: 10.0000 mg | INTRAMUSCULAR | Status: DC | PRN

## 2023-08-05 MED ORDER — SODIUM CHLORIDE 0.9 % IV SOLN: INTRAVENOUS | Status: DC

## 2023-08-05 MED ORDER — LIDOCAINE HCL (PF) 1 % IJ SOLN
INTRAMUSCULAR | Status: AC
  Filled 2023-08-05: qty 30

## 2023-08-05 MED ORDER — SODIUM CHLORIDE 0.9 % WEIGHT BASED INFUSION
3.0000 mL/kg/h | INTRAVENOUS | Status: AC
  Administered 2023-08-05: 3 mL/kg/h via INTRAVENOUS

## 2023-08-05 MED ORDER — LABETALOL HCL 5 MG/ML IV SOLN: 10.0000 mg | INTRAVENOUS | Status: DC | PRN

## 2023-08-05 MED ORDER — ENOXAPARIN SODIUM 40 MG/0.4ML IJ SOSY: 40.0000 mg | PREFILLED_SYRINGE | INTRAMUSCULAR | Status: DC

## 2023-08-05 MED ORDER — ASPIRIN 81 MG PO CHEW: 81.0000 mg | CHEWABLE_TABLET | ORAL | Status: DC

## 2023-08-05 MED ORDER — HEPARIN SODIUM (PORCINE) 1000 UNIT/ML IJ SOLN
INTRAMUSCULAR | Status: DC | PRN
  Administered 2023-08-05 (×2): 2000 [IU] via INTRAVENOUS
  Administered 2023-08-05: 8000 [IU] via INTRAVENOUS

## 2023-08-05 MED ORDER — LOSARTAN POTASSIUM 25 MG PO TABS
25.0000 mg | ORAL_TABLET | Freq: Every day | ORAL | 2 refills | Status: DC
Start: 2023-08-06 — End: 2023-10-01
  Filled 2023-08-05: qty 30, 30d supply, fill #0

## 2023-08-05 MED ORDER — MIDAZOLAM HCL 2 MG/2ML IJ SOLN
INTRAMUSCULAR | Status: AC
  Filled 2023-08-05: qty 2

## 2023-08-05 MED ORDER — HEPARIN (PORCINE) IN NACL 1000-0.9 UT/500ML-% IV SOLN
INTRAVENOUS | Status: DC | PRN
  Administered 2023-08-05 (×3): 500 mL

## 2023-08-05 MED ORDER — TICAGRELOR 90 MG PO TABS
90.0000 mg | ORAL_TABLET | Freq: Two times a day (BID) | ORAL | 0 refills | Status: DC
Start: 2023-08-05 — End: 2023-08-21
  Filled 2023-08-05: qty 60, 30d supply, fill #0

## 2023-08-05 SURGICAL SUPPLY — 18 items
BALLOON EMERGE MR 2.0X15 (BALLOONS) IMPLANT
BALLOON EMERGE MR 2.0X8 (BALLOONS) IMPLANT
BALLOON ~~LOC~~ EMERGE MR 2.25X12 (BALLOONS) IMPLANT
BALLOON ~~LOC~~ EMERGE MR 2.5X12 (BALLOONS) IMPLANT
CATH INFINITI JR4 5F (CATHETERS) IMPLANT
CATH VISTA GUIDE 6FR XBLD 3.5 (CATHETERS) IMPLANT
DEVICE RAD COMP TR BAND LRG (VASCULAR PRODUCTS) IMPLANT
GLIDESHEATH SLEND SS 6F .021 (SHEATH) IMPLANT
GUIDEWIRE INQWIRE 1.5J.035X260 (WIRE) IMPLANT
GUIDEWIRE PRESSURE X 175 (WIRE) IMPLANT
KIT ENCORE 26 ADVANTAGE (KITS) IMPLANT
KIT ESSENTIALS PG (KITS) IMPLANT
KIT SYRINGE INJ CVI SPIKEX1 (MISCELLANEOUS) IMPLANT
PACK CARDIAC CATHETERIZATION (CUSTOM PROCEDURE TRAY) ×1 IMPLANT
SET ATX-X65L (MISCELLANEOUS) IMPLANT
STENT ONYX FRONTIER 2.0X18 (Permanent Stent) IMPLANT
STENT SYNERGY XD 2.25X20 (Permanent Stent) IMPLANT
TUBING CIL FLEX 10 FLL-RA (TUBING) IMPLANT

## 2023-08-05 NOTE — Progress Notes (Signed)
 TR band removed at 1915, gauze dressing applied. Right radial level 0, clean, dry, and intact. Patient walked to the bathroom without difficulties.

## 2023-08-05 NOTE — H&P (View-Only) (Signed)
  Progress Note  Patient Name: Tanner Mcmillan Date of Encounter: 08/05/2023 [facility] Cardiologist: None   Interval Summary    Patient feeling well today, is ready to go home. Discussed plans for staged PCI of his LAD today.   Vital Signs Vitals:   08/04/23 2256 08/05/23 0334 08/05/23 0419 08/05/23 0824  BP: 116/89 (!) 134/93  (!) 128/91  Pulse: 87 90  88  Resp: 15 16  17   Temp: 98.1 F (36.7 C) 97.9 F (36.6 C)  98 F (36.7 C)  TempSrc: Oral Oral  Oral  SpO2: 97% 94%  94%  Weight:   78.5 kg   Height:        Intake/Output Summary (Last 24 hours) at 08/05/2023 0902 Last data filed at 08/04/2023 1800 Gross per 24 hour  Intake 240 ml  Output 1350 ml  Net -1110 ml      08/05/2023    4:19 AM 08/04/2023   11:08 AM 08/04/2023    5:00 AM  Last 3 Weights  Weight (lbs) 173 lb 1 oz 174 lb 174 lb 13.2 oz  Weight (kg) 78.5 kg 78.926 kg 79.3 kg      Telemetry/ECG  Sinus rhythm with rates 80s-90s - Personally Reviewed  Physical Exam  GEN: No acute distress.   Neck: No JVD Cardiac: RRR, no murmurs, rubs, or gallops.  Respiratory: Faint crackles in bases, atelectasis suspected GI: Soft, nontender, non-distended  MS: No edema  Assessment & Plan  Mr. Gilreath  is a 53 yo male with history of tobacco abuse who presented with EMS with active chest pain that began at 9:30am on 7/5. EKG with lateral ST elevation c/w acute MI.   STEMI CAD Patient admitted with anterolateral STEMI. He underwent urgent LHC with Dr. Verlin and was found with 99% stenosis of mid RCA with 70% eccentric mid LAD stenosis. Culprit RCA lesion treated with DES x2 with noted resolution of ST segment changes. Patient remains hospitalized for staged PCI of his LAD. Staged PCI of LAD planned for today Continue DAPT with ASA and Brilinta  Continue high intensity Atorvastatin  80mg   Ischemic cardiomyopathy TTE this admission in setting of STEMI revealed mildly reduced LVEF of 50%. RWMA noted with  hypokinetic basal inferolateral segment, basal anterolateral segment, and basal inferior segment. Given this, patient started on Losartan  25mg  by Dr. Rolan. Remains euvolemic on exam, continue Losartan  25mg .    Medical Readiness Date: 08/05/2023    For questions or updates, please contact [facility] Please consult [email or web address] for contact info under       Signed, Artist Pouch, PA-C   Patient seen, examined. Available data reviewed. Agree with findings, assessment, and plan as outlined by Artist Pouch, PA-C.  On my exam the patient is alert, oriented, no distress.  HEENT is normal, JVP is normal, lungs clear bilaterally, heart is regular rate and rhythm no murmur gallop, abdomen soft nontender, extremities with no edema, right radial cath site is clear with no hematoma or ecchymosis.  Plans noted to perform staged PCI of the LAD today. Cath films reviewed and he has at least moderate stenosis of a large first diagonal as well. Will reevaluate both lesions with angiography today.  Reviewed with patient who understands and agrees to proceed.  He is stable after an inferior STEMI with no arrhythmia or heart failure symptoms.  Otherwise, as outlined above.  Will try to get him home this afternoon after the procedure.  Ozell Fell, M.D. 08/05/2023 11:30 AM

## 2023-08-05 NOTE — Interval H&P Note (Signed)
 History and Physical Interval Note:  08/05/2023 2:27 PM  Tanner Mcmillan  has presented today for surgery, with the diagnosis of STEMI with staged PCI.  The various methods of treatment have been discussed with the patient and family. After consideration of risks, benefits and other options for treatment, the patient has consented to  Procedure(s): CORONARY STENT INTERVENTION (N/A) as a surgical intervention.  The patient's history has been reviewed, patient examined, no change in status, stable for surgery.  I have reviewed the patient's chart and labs.  Questions were answered to the patient's satisfaction.    Cath Lab Visit (complete for each Cath Lab visit)  Clinical Evaluation Leading to the Procedure:   ACS: Yes.    Non-ACS:  N/A  Lynzee Lindquist

## 2023-08-05 NOTE — Discharge Instructions (Signed)

## 2023-08-05 NOTE — Progress Notes (Signed)
 CARDIAC REHAB PHASE I   Pt has been ambulating in room. Declined walking in hall, awaiting cath. Discussed with pt and mom MI, stent, Brilinta  importance, restrictions, smoking cessation, diet, exercise guidelines, NTG, and CRPII. Pt receptive, he is thinking about quitting smoking and doing well on a nicotine  patch. Would benefit from rx.  Will refer to [facility] CRPII.  [id number] Aliene Aris BS, ACSM-CEP 08/05/2023 9:54 AM

## 2023-08-05 NOTE — Telephone Encounter (Signed)
   Transition of Care Follow-up Phone Call Request    Patient Name: Tanner Mcmillan Date of Birth: [birth year] Date of Encounter: 08/05/2023  Primary Care Provider:  Patient, No Pcp Per Primary Cardiologist:  Lonni Cash, MD  [name] has been scheduled for a transition of care follow up appointment with a [facility] provider:  08/21/23 at 8:25 AM with Jon Hails PA-C  Please reach out to [name] within 48 hours of discharge to confirm appointment and review transition of care protocol questionnaire. Anticipated discharge date: 08/05/23  Artist Pouch, PA-C  08/05/2023, 5:33 PM

## 2023-08-05 NOTE — Plan of Care (Signed)
   Problem: Health Behavior/Discharge Planning: Goal: Ability to manage health-related needs will improve Outcome: Progressing

## 2023-08-05 NOTE — Telephone Encounter (Signed)
 Patient Product/process development scientist completed.    The patient is insured through [employer]. Patient has [employer], may use a copay card, and/or apply for patient assistance if available.    Ran test claim for ticagrelor  (Brilinta ) 90 mg and the current 30 day co-pay is $20.00.  Ran test claim for prasugrel (Effient) 10 mg and the current 30 day co-pay is $20.00.  This test claim was processed through [facility]- copay amounts may vary at other pharmacies due to pharmacy/plan contracts, or as the patient moves through the different stages of their insurance plan.     Reyes Sharps, CPHT Pharmacy Technician III Certified Patient Advocate [facility] Patient Advocate Team Direct Number: [phone]  Fax: [phone]

## 2023-08-05 NOTE — Progress Notes (Addendum)
  Progress Note  Patient Name: Tanner Mcmillan Date of Encounter: 08/05/2023 [facility] Cardiologist: None   Interval Summary    Patient feeling well today, is ready to go home. Discussed plans for staged PCI of his LAD today.   Vital Signs Vitals:   08/04/23 2256 08/05/23 0334 08/05/23 0419 08/05/23 0824  BP: 116/89 (!) 134/93  (!) 128/91  Pulse: 87 90  88  Resp: 15 16  17   Temp: 98.1 F (36.7 C) 97.9 F (36.6 C)  98 F (36.7 C)  TempSrc: Oral Oral  Oral  SpO2: 97% 94%  94%  Weight:   78.5 kg   Height:        Intake/Output Summary (Last 24 hours) at 08/05/2023 0902 Last data filed at 08/04/2023 1800 Gross per 24 hour  Intake 240 ml  Output 1350 ml  Net -1110 ml      08/05/2023    4:19 AM 08/04/2023   11:08 AM 08/04/2023    5:00 AM  Last 3 Weights  Weight (lbs) 173 lb 1 oz 174 lb 174 lb 13.2 oz  Weight (kg) 78.5 kg 78.926 kg 79.3 kg      Telemetry/ECG  Sinus rhythm with rates 80s-90s - Personally Reviewed  Physical Exam  GEN: No acute distress.   Neck: No JVD Cardiac: RRR, no murmurs, rubs, or gallops.  Respiratory: Faint crackles in bases, atelectasis suspected GI: Soft, nontender, non-distended  MS: No edema  Assessment & Plan  Tanner Mcmillan  is a 53 yo male with history of tobacco abuse who presented with EMS with active chest pain that began at 9:30am on 7/5. EKG with lateral ST elevation c/w acute MI.   STEMI CAD Patient admitted with anterolateral STEMI. He underwent urgent LHC with Dr. Verlin and was found with 99% stenosis of mid RCA with 70% eccentric mid LAD stenosis. Culprit RCA lesion treated with DES x2 with noted resolution of ST segment changes. Patient remains hospitalized for staged PCI of his LAD. Staged PCI of LAD planned for today Continue DAPT with ASA and Brilinta  Continue high intensity Atorvastatin  80mg   Ischemic cardiomyopathy TTE this admission in setting of STEMI revealed mildly reduced LVEF of 50%. RWMA noted with  hypokinetic basal inferolateral segment, basal anterolateral segment, and basal inferior segment. Given this, patient started on Losartan  25mg  by Dr. Rolan. Remains euvolemic on exam, continue Losartan  25mg .    Medical Readiness Date: 08/05/2023    For questions or updates, please contact [facility] Please consult [email or web address] for contact info under       Signed, Artist Pouch, PA-C   Patient seen, examined. Available data reviewed. Agree with findings, assessment, and plan as outlined by Artist Pouch, PA-C.  On my exam the patient is alert, oriented, no distress.  HEENT is normal, JVP is normal, lungs clear bilaterally, heart is regular rate and rhythm no murmur gallop, abdomen soft nontender, extremities with no edema, right radial cath site is clear with no hematoma or ecchymosis.  Plans noted to perform staged PCI of the LAD today. Cath films reviewed and he has at least moderate stenosis of a large first diagonal as well. Will reevaluate both lesions with angiography today.  Reviewed with patient who understands and agrees to proceed.  He is stable after an inferior STEMI with no arrhythmia or heart failure symptoms.  Otherwise, as outlined above.  Will try to get him home this afternoon after the procedure.  Tanner Mcmillan, M.D. 08/05/2023 11:30 AM

## 2023-08-05 NOTE — Discharge Summary (Addendum)
 Discharge Summary   Patient ID: LAQUINTON BIHM MRN: [id number]; DOB: 07-07-1970  Admit date: 08/03/2023 Discharge date: 08/05/2023  PCP:  Patient, No Pcp Per   [name] Cardiologist:  Lonni Cash, MD       Discharge Diagnoses  Principal Problem:   Acute ST elevation myocardial infarction (STEMI) of anterolateral wall [facility])   Diagnostic Studies/Procedures    08/05/23 Staged PCI  FINDINGS: Significant multifocal D1 disease. Moderate LAD disease that is not hemodynamically significant. Normal LVEDP. Successful PCI to superior and inferior branches of D1 with 0% residual stenosis and TIMI-3 flow.   RECOMMENDATIONS: DAPT with ASA and ticagrelor  for at least 12 months. Aggressive secondary prevention.   08/03/23 LHC    Mid RCA lesion is 99% stenosed.   Dist RCA lesion is 70% stenosed.   1st Mrg lesion is 40% stenosed.   Mid LAD lesion is 70% stenosed.   1st Diag lesion is 50% stenosed.   A drug-eluting stent was successfully placed using a STENT SYNERGY XD 3.0X24.   A drug-eluting stent was successfully placed using a STENT SYNERGY XD [id number].   Post intervention, there is a 0% residual stenosis.   Post intervention, there is a 0% residual stenosis.   Acute anterolateral STEMI Successful PTCA/DES x 2 mid and distal RCA Severe stenosis mid LAD (70% eccentric).  Segmental LV dysfunction with anteroapical hypokinesis.    Recommendations: Will admit to the ICU. Aggrastat  infusion for 2 hours post cath. DAPT with ASA and Brilinta  for one year. High intensity statin   08/03/23 TTE IMPRESSIONS     1. Left ventricular ejection fraction, by estimation, is 50 to 55%. Left  ventricular ejection fraction by 3D volume is 50 %. The left ventricle has  low normal function. The left ventricle demonstrates regional wall motion  abnormalities (see scoring  diagram/findings for description). There is moderate concentric left  ventricular  hypertrophy. Left ventricular diastolic parameters are  indeterminate.   2. Right ventricular systolic function is normal. The right ventricular  size is normal. Tricuspid regurgitation signal is inadequate for assessing  PA pressure.   3. The mitral valve is grossly normal. Trivial mitral valve  regurgitation.   4. The aortic valve is tricuspid. Aortic valve regurgitation is not  visualized.   5. The inferior vena cava is normal in size with greater than 50%  respiratory variability, suggesting right atrial pressure of 3 mmHg.   Comparison(s): No prior Echocardiogram.   FINDINGS   Left Ventricle: Left ventricular ejection fraction, by estimation, is 50  to 55%. Left ventricular ejection fraction by 3D volume is 50 %. The left  ventricle has low normal function. The left ventricle demonstrates  regional wall motion abnormalities. The   left ventricular internal cavity size was normal in size. There is  moderate concentric left ventricular hypertrophy. Left ventricular  diastolic parameters are indeterminate.     LV Wall Scoring:  The basal inferolateral segment, basal anterolateral segment, and basal  inferior segment are hypokinetic. The entire anterior wall, mid and distal  lateral wall, entire septum, entire apex, mid and distal inferior wall,  and  mid anterolateral segment are normal.   Right Ventricle: The right ventricular size is normal. No increase in  right ventricular wall thickness. Right ventricular systolic function is  normal. Tricuspid regurgitation signal is inadequate for assessing PA  pressure.   Left Atrium: Left atrial size was normal in size.   Right Atrium: Right atrial size was normal in size.  Pericardium: Trivial pericardial effusion is present. The pericardial  effusion is posterior to the left ventricle.   Mitral Valve: The mitral valve is grossly normal. Trivial mitral valve  regurgitation.   Tricuspid Valve: The tricuspid valve is grossly  normal. Tricuspid valve  regurgitation is trivial.   Aortic Valve: The aortic valve is tricuspid. Aortic valve regurgitation is  not visualized.   Pulmonic Valve: The pulmonic valve was grossly normal. Pulmonic valve  regurgitation is not visualized.   Aorta: The aortic root is normal in size and structure.   Venous: The inferior vena cava is normal in size with greater than 50%  respiratory variability, suggesting right atrial pressure of 3 mmHg.   IAS/Shunts: No atrial level shunt detected by color flow Doppler.   Additional Comments: 3D was performed not requiring image post processing  on an independent workstation and was normal.   _____________   History of Present Illness   Mr. Tanner Mcmillan  is a 53 yo male with history of tobacco abuse who presented with EMS with active chest pain that began at 9:30am on 7/5. EKG with lateral ST elevation c/w acute MI.    Hospital Course   Consultants: n/a   STEMI CAD Patient admitted with anterolateral STEMI. He underwent urgent LHC with Dr. Verlin and was found with 99% stenosis of mid RCA with 70% eccentric mid LAD stenosis. Culprit RCA lesion treated with DES x2 with noted resolution of ST segment changes. Today underwent staged PCI to superior and inferior branches of D1 with 0% residual stenosis and TIMI-3 flow.  Continue DAPT with ASA and Brilinta  for at least 12 months. Continue high intensity Atorvastatin  80mg . Will need LFTs and lipid panel checked in 6-8 weeks.    Ischemic cardiomyopathy Hypertension TTE this admission in setting of STEMI revealed mildly reduced LVEF of 50%. RWMA noted with hypokinetic basal inferolateral segment, basal anterolateral segment, and basal inferior segment. Given this, patient started on Losartan  25mg  by Dr. Rolan. Remains euvolemic on exam, continue Losartan  25mg . Will need BMET at clinic follow up.       Did the patient have an acute coronary syndrome (MI, NSTEMI, STEMI, etc) this admission?:   Yes                               AHA/ACC ACS Clinical Performance & Quality Measures: Aspirin  prescribed? - Yes ADP Receptor Inhibitor (Plavix/Clopidogrel, Brilinta /Ticagrelor  or Effient/Prasugrel) prescribed (includes medically managed patients)? - Yes Beta Blocker prescribed? - No. The patient has an EF >/= 50%. Based upon the [facility] Study pub in Apr 2024, there is no benefit in patients with preserved EF post MI. High Intensity Statin (Lipitor 40-80mg  or Crestor 20-40mg ) prescribed? - Yes EF assessed during THIS hospitalization? - Yes For EF <40%, was ACEI/ARB prescribed? - Not Applicable (EF >/= 40%) For EF <40%, Aldosterone Antagonist (Spironolactone or Eplerenone) prescribed? - Not Applicable (EF >/= 40%) Cardiac Rehab Phase II ordered (including medically managed patients)? - Yes       The patient will be scheduled for a TOC follow up appointment in the next 14 days.  A message has been sent to the [facility] and Scheduling Pool at the office where the patient should be seen for follow up.  _____________  Discharge Vitals Blood pressure (!) 143/98, pulse 90, temperature 98.1 F (36.7 C), temperature source Oral, resp. rate 14, height 5' 6 (1.676 m), weight 78.5 kg, SpO2 95%.  Filed Weights  08/04/23 0500 08/04/23 1108 08/05/23 0419  Weight: 79.3 kg 78.9 kg 78.5 kg    Labs & Radiologic Studies  CBC Recent Labs    08/03/23 1059 08/03/23 1135 08/04/23 0232 08/05/23 0253  WBC 12.9*  --  12.6* 15.6*  NEUTROABS 9.3*  --   --   --   HGB 15.6   < > 16.3 16.4  HCT 46.6   < > 48.2 50.2  MCV 95.3  --  95.1 96.7  PLT 340  --  347 349   < > = values in this interval not displayed.   Basic Metabolic Panel Recent Labs    92/94/74 1905 08/04/23 0232 08/05/23 0253  NA 139 139 139  K 4.3 4.2 3.8  CL 105 103 99  CO2 23 26 28   GLUCOSE 91 111* 100*  BUN 8 10 12   CREATININE 0.71 0.76 0.94  CALCIUM  9.7 9.5 9.5  MG 2.0  --   --    Liver Function Tests Recent Labs     08/03/23 1059  AST 19  ALT 23  ALKPHOS 76  BILITOT <0.2  PROT 6.5  ALBUMIN 3.6   No results for input(s): LIPASE, AMYLASE in the last 72 hours. High Sensitivity Troponin:   Recent Labs  Lab 08/03/23 1059 08/03/23 1237  TROPONINIHS 25* 3,839*    No results for input(s): TRNPT in the last 720 hours.  BNP Invalid input(s): POCBNP No results for input(s): PROBNP in the last 72 hours.  No results for input(s): BNP in the last 72 hours.  D-Dimer No results for input(s): DDIMER in the last 72 hours. Hemoglobin A1C Recent Labs    08/03/23 1059  HGBA1C 6.2*   Fasting Lipid Panel Recent Labs    08/03/23 1059  CHOL 192  HDL 32*  LDLCALC 129*  TRIG 156*  CHOLHDL 6.0   No results found for: LIPOA  Thyroid Function Tests No results for input(s): TSH, T4TOTAL, T3FREE, THYROIDAB in the last 72 hours.  Invalid input(s): FREET3 _____________  CARDIAC CATHETERIZATION Result Date: 08/05/2023   Mid RCA lesion is 99% stenosed.   Dist RCA lesion is 70% stenosed.   1st Mrg lesion is 40% stenosed.   Mid LAD lesion is 70% stenosed.   1st Diag lesion is 50% stenosed.   A drug-eluting stent was successfully placed using a STENT SYNERGY XD 3.0X24.   A drug-eluting stent was successfully placed using a STENT SYNERGY XD [id number].   Post intervention, there is a 0% residual stenosis.   Post intervention, there is a 0% residual stenosis. Acute anterolateral STEMI Successful PTCA/DES x 2 mid and distal RCA Severe stenosis mid LAD (70% eccentric). Segmental LV dysfunction with anteroapical hypokinesis. Recommendations: Will admit to the ICU. Aggrastat  infusion for 2 hours post cath. DAPT with ASA and Brilinta  for one year. High intensity statin. Echo this weekend. Will plan staged PCI of the mid LAD on Monday.   ECHOCARDIOGRAM COMPLETE Result Date: 08/03/2023    ECHOCARDIOGRAM REPORT   Patient Name:   Tanner Mcmillan Date of Exam: 08/03/2023 Medical Rec #:  [id number]         Height:       66.0 in Accession #:    [id number]       Weight:       170.0 lb Date of Birth:  [birth year]        BSA:          1.866 m Patient Age:    29 years  BP:           150/84 mmHg Patient Gender: M                HR:           80 bpm. Exam Location:  [facility] Procedure: 2D Echo, 3D Echo, Cardiac Doppler, Color Doppler and Strain Analysis            (Both Spectral and Color Flow Doppler were utilized during            procedure). Indications:    CAD Native Vessel l25.10  History:        Patient has no prior history of Echocardiogram examinations.                 This admission with acute NSTEMI.  Sonographer:    Aida Pizza RCS Referring Phys: [phone] CHRISTOPHER D [facility]  Sonographer Comments: Global longitudinal strain was attempted. IMPRESSIONS  1. Left ventricular ejection fraction, by estimation, is 50 to 55%. Left ventricular ejection fraction by 3D volume is 50 %. The left ventricle has low normal function. The left ventricle demonstrates regional wall motion abnormalities (see scoring diagram/findings for description). There is moderate concentric left ventricular hypertrophy. Left ventricular diastolic parameters are indeterminate.  2. Right ventricular systolic function is normal. The right ventricular size is normal. Tricuspid regurgitation signal is inadequate for assessing PA pressure.  3. The mitral valve is grossly normal. Trivial mitral valve regurgitation.  4. The aortic valve is tricuspid. Aortic valve regurgitation is not visualized.  5. The inferior vena cava is normal in size with greater than 50% respiratory variability, suggesting right atrial pressure of 3 mmHg. Comparison(s): No prior Echocardiogram. FINDINGS  Left Ventricle: Left ventricular ejection fraction, by estimation, is 50 to 55%. Left ventricular ejection fraction by 3D volume is 50 %. The left ventricle has low normal function. The left ventricle demonstrates regional wall motion abnormalities. The  left ventricular  internal cavity size was normal in size. There is moderate concentric left ventricular hypertrophy. Left ventricular diastolic parameters are indeterminate.  LV Wall Scoring: The basal inferolateral segment, basal anterolateral segment, and basal inferior segment are hypokinetic. The entire anterior wall, mid and distal lateral wall, entire septum, entire apex, mid and distal inferior wall, and mid anterolateral segment are normal. Right Ventricle: The right ventricular size is normal. No increase in right ventricular wall thickness. Right ventricular systolic function is normal. Tricuspid regurgitation signal is inadequate for assessing PA pressure. Left Atrium: Left atrial size was normal in size. Right Atrium: Right atrial size was normal in size. Pericardium: Trivial pericardial effusion is present. The pericardial effusion is posterior to the left ventricle. Mitral Valve: The mitral valve is grossly normal. Trivial mitral valve regurgitation. Tricuspid Valve: The tricuspid valve is grossly normal. Tricuspid valve regurgitation is trivial. Aortic Valve: The aortic valve is tricuspid. Aortic valve regurgitation is not visualized. Pulmonic Valve: The pulmonic valve was grossly normal. Pulmonic valve regurgitation is not visualized. Aorta: The aortic root is normal in size and structure. Venous: The inferior vena cava is normal in size with greater than 50% respiratory variability, suggesting right atrial pressure of 3 mmHg. IAS/Shunts: No atrial level shunt detected by color flow Doppler. Additional Comments: 3D was performed not requiring image post processing on an independent workstation and was normal.  LEFT VENTRICLE PLAX 2D LVIDd:         5.15 cm         Diastology LVIDs:  3.75 cm         LV e' medial:    7.46 cm/s LV PW:         1.30 cm         LV E/e' medial:  11.1 LV IVS:        1.30 cm         LV e' lateral:   8.08 cm/s LVOT diam:     2.00 cm         LV E/e' lateral: 10.2 LV SV:         60 LV SV  Index:   32 LVOT Area:     3.14 cm        3D Volume EF                                LV 3D EF:    Left                                             ventricul                                             ar                                             ejection                                             fraction                                             by 3D                                             volume is                                             50 %.                                 3D Volume EF:                                3D EF:        50 %                                LV EDV:       157 ml  LV ESV:       78 ml                                LV SV:        79 ml RIGHT VENTRICLE RV S prime:     10.40 cm/s TAPSE (M-mode): 1.6 cm LEFT ATRIUM             Index        RIGHT ATRIUM           Index LA diam:        3.90 cm 2.09 cm/m   RA Area:     11.10 cm LA Vol (A2C):   43.0 ml 23.04 ml/m  RA Volume:   25.00 ml  13.40 ml/m LA Vol (A4C):   44.8 ml 24.00 ml/m LA Biplane Vol: 44.5 ml 23.84 ml/m  AORTIC VALVE LVOT Vmax:   98.80 cm/s LVOT Vmean:  71.000 cm/s LVOT VTI:    0.190 m  AORTA Ao Root diam: 3.70 cm MITRAL VALVE MV Area (PHT): 3.91 cm    SHUNTS MV Decel Time: 194 msec    Systemic VTI:  0.19 m MV E velocity: 82.50 cm/s  Systemic Diam: 2.00 cm MV A velocity: 91.70 cm/s MV E/A ratio:  0.90 Jayson Sierras MD Electronically signed by Jayson Sierras MD Signature Date/Time: 08/03/2023/3:12:08 PM    Final     Disposition Pt is being discharged home today in good condition.  Follow-up Plans & Appointments  Discharge Instructions     AMB Referral to Cardiac Rehabilitation - Phase II   Complete by: As directed    Diagnosis:  Coronary Stents STEMI     After initial evaluation and assessments completed: Virtual Based Care may be provided alone or in conjunction with Phase 2 Cardiac Rehab based on patient barriers.: Yes   Intensive Cardiac Rehabilitation (ICR) [facility]  location only OR Traditional Cardiac Rehabilitation (TCR) *If criteria for ICR are not met will enroll in TCR ([facility] only): Yes   Amb Referral to Cardiac Rehabilitation   Complete by: As directed    Diagnosis:  Coronary Stents STEMI PTCA     After initial evaluation and assessments completed: Virtual Based Care may be provided alone or in conjunction with Phase 2 Cardiac Rehab based on patient barriers.: Yes   Intensive Cardiac Rehabilitation (ICR) [facility] location only OR Traditional Cardiac Rehabilitation (TCR) *If criteria for ICR are not met will enroll in TCR [facility] only): Yes       Discharge Medications Allergies as of 08/05/2023       Reactions   Codeine Other (See Comments)   Weird feeling; hallucinations   Allegra-d Allergy & Congestion [fexofenadine-pseudoephed Er] Other (See Comments)   -pt unsure        Medication List     TAKE these medications    aspirin  81 MG chewable tablet Chew 1 tablet (81 mg total) by mouth daily. Start taking on: August 06, 2023   atorvastatin  80 MG tablet Commonly known as: LIPITOR Take 1 tablet (80 mg total) by mouth daily. Start taking on: August 06, 2023   losartan  25 MG tablet Commonly known as: COZAAR  Take 1 tablet (25 mg total) by mouth daily. Start taking on: August 06, 2023   nicotine  14 mg/24hr patch Commonly known as: NICODERM CQ  - dosed in mg/24 hours Place 1 patch (14 mg total) onto the skin daily. Start taking on: August 06, 2023   nitroGLYCERIN  0.4 MG SL tablet Commonly known as: Nitrostat  Place 1 tablet (0.4 mg total) under the tongue every 5 (five) minutes as needed for chest pain.   ticagrelor  90 MG Tabs tablet Commonly known as: BRILINTA  Take 1 tablet (90 mg total) by mouth 2 (two) times daily.         Outstanding Labs/Studies BMET at follow up  Duration of Discharge Encounter: APP Time: 25 minutes   Signed, Artist Pouch, PA-C 08/05/2023, 5:25 PM  The patient is independently interviewed and examined.  I agree  with the clinical findings, assessment, and plan as outlined above by Artist Pouch, PA-C.  Findings from my exam today are below:  Vitals:   08/05/23 1800 08/05/23 1909  BP: 128/84 114/81  Pulse: 90 97  Resp:    Temp:    SpO2: 94% 94%     Pt is alert and oriented, NAD HEENT: normal Neck: JVP - normal Lungs: CTA bilaterally CV: RRR without murmur or gallop Abd: soft, NT, Positive BS, no hepatomegaly Ext: no C/C/E, distal pulses intact and equal. Right radial cath site(s) clear Skin: warm/dry no rash  MD time spent conducting this discharge is 30 minutes.  This includes my personal examination of the patient, review of vital signs, labs, echo findings, cardiac cath findings, radiographic images, and discussion of post Hospital restrictions/recovery as well as recommendations for outpatient follow-up.  See my progress note this same date. The patient has now undergone staged PCI of the first diagonal branches and pressure wire analysis of the LAD. His post-MI medical regimen includes ASA, ticagrelor , a high intensity statin drug, and losartan  in the setting of mild LV dysfunction. Pt is medically stable for hospital DC.   Ozell Fell 08/05/2023 10:30 PM

## 2023-08-05 NOTE — Brief Op Note (Signed)
 BRIEF CARDIAC CATHETERIZATION NOTE  08/05/2023  4:43 PM  PATIENT:  Tanner Mcmillan  53 y.o. male  PRE-OPERATIVE DIAGNOSIS:  STEMI with staged PCI  POST-OPERATIVE DIAGNOSIS:  Same  PROCEDURE:  Procedure(s): CORONARY STENT INTERVENTION (N/A) CORONARY PRESSURE/FFR STUDY (N/A) LEFT HEART CATH AND CORONARY ANGIOGRAPHY (N/A)  SURGEON:  Surgeons and Role:    DEWAINE Mady Bruckner, MD - Primary  FINDINGS: Significant multifocal D1 disease. Moderate LAD disease that is not hemodynamically significant. Normal LVEDP. Successful PCI to superior and inferior branches of D1 with 0% residual stenosis and TIMI-3 flow.  RECOMMENDATIONS: DAPT with ASA and ticagrelor  for at least 12 months. Aggressive secondary prevention. Anticipate d/c this evening if no post-PCI complications occur during 4 hours of monitoring.  Bruckner Mady, MD [facility]

## 2023-08-06 ENCOUNTER — Encounter (HOSPITAL_COMMUNITY): Payer: Self-pay | Admitting: Internal Medicine

## 2023-08-06 NOTE — Telephone Encounter (Signed)
 Patient contacted regarding discharge from [facility] on 08/05/23  Patient understands to follow up with Angie Duke on 08/21/23 at 8:25 am at [facility]  Patient understands discharge instructions? Yes  Patient understands medications and regiment? Yes  Patient understands to bring all medications to this visit? Yes  Pt reporting doing okay today other than back spasms this morning from laying in the bed, dressing is clean, dry, intact on wrist.  No numbness or tingling noted to extremity.  Adv to remove dressing today and shower.  Pt states he was instructed to leave the dressing on until it starts to fall off.  He will remove for tomorrow prior to showering.  Will call if any questions/concerns before his follow up appointment.

## 2023-08-14 NOTE — Progress Notes (Signed)
 Cardiology Office Note:    Date:  08/21/2023   ID:  Tanner Mcmillan, DOB July 02, 1970, MRN [id number]  PCP:  Patient, No Pcp Per   [facility] Providers Cardiologist:  Lonni Cash, MD     Referring MD: No ref. provider found   Chief Complaint  Patient presents with   Hospitalization Follow-up    Post STEMI    History of Present Illness:    Tanner Mcmillan is a 53 y.o. male with a hx of CAD with STEMI on 08/03/2023 and tobacco abuse.  He presented with anterior lateral STEMI.  Emergent heart catheterization showed 99% mid RCA stenosis felt to be the culprit lesion with 70% eccentric mid LAD stenosis.  He was initially treated with DES x 2 to RCA.  He was brought back to the Cath Lab in a staged fashion to treat superior and inferior branches of the D1: 2.25 x 20 mm DES in the superior branch and 2.0 x 18 mm DES in the inferior branch.  He had a mild ischemic cardiomyopathy.  Echo with low-normal LVEF 50-55%, WMA with hypokinesis in the basal inferolateral segment, basal anterolateral segment, and basal inferior segment   He presents for routine cardiology follow-up. He is having sensations in his chest with some activity and anxiety, has not taken a NTG.   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 He is walking a good amount in the hospital and is not having exertional symptoms.   He is being asked to return to full duty lifting 50 lbs.   Discussed smoking cessation for greater than 5 minutes.   No past medical history on file.  Past Surgical History:  Procedure Laterality Date   CORONARY PRESSURE/FFR STUDY N/A 08/05/2023   Procedure: CORONARY PRESSURE/FFR STUDY;  Surgeon: Mady Lonni, MD;  Location: [facility];  Service: Cardiovascular;  Laterality: N/A;   CORONARY STENT INTERVENTION N/A 08/05/2023   Procedure: CORONARY STENT INTERVENTION;  Surgeon: Mady Lonni, MD;  Location: [facility];  Service:  Cardiovascular;  Laterality: N/A;   CORONARY/GRAFT ACUTE MI REVASCULARIZATION N/A 08/03/2023   Procedure: Coronary/Graft Acute MI Revascularization;  Surgeon: Cash Lonni JONETTA, MD;  Location: [facility];  Service: Cardiovascular;  Laterality: N/A;   HAND SURGERY     LEFT HEART CATH AND CORONARY ANGIOGRAPHY N/A 08/03/2023   Procedure: LEFT HEART CATH AND CORONARY ANGIOGRAPHY;  Surgeon: Cash Lonni JONETTA, MD;  Location: [facility];  Service: Cardiovascular;  Laterality: N/A;   LEFT HEART CATH AND CORONARY ANGIOGRAPHY N/A 08/05/2023   Procedure: LEFT HEART CATH AND CORONARY ANGIOGRAPHY;  Surgeon: Mady Lonni, MD;  Location: [facility];  Service: Cardiovascular;  Laterality: N/A;    Current Medications: Current Meds  Medication Sig   aspirin  81 MG chewable tablet Chew 1 tablet (81 mg total) by mouth daily.   atorvastatin  (LIPITOR) 80 MG tablet Take 1 tablet (80 mg total) by mouth daily.   losartan  (COZAAR ) 25 MG tablet Take 1 tablet (25 mg total) by mouth daily.   metoprolol  succinate (TOPROL  XL) 25 MG 24 hr tablet Take 0.5 tablets (12.5 mg total) by mouth at bedtime.   nicotine  (NICODERM CQ  - DOSED IN MG/24 HOURS) 14 mg/24hr patch Place 1 patch (14 mg total) onto the skin daily.   nitroGLYCERIN  (NITROSTAT ) 0.4 MG SL tablet Place 1 tablet (0.4 mg total) under the tongue every 5 (five) minutes as needed for chest pain.   [  DISCONTINUED] ticagrelor  (BRILINTA ) 90 MG TABS tablet Take 1 tablet (90 mg total) by mouth 2 (two) times daily.     Allergies:   Codeine and Allegra-d allergy & congestion [fexofenadine-pseudoephed er]   Social History   Socioeconomic History   Marital status: Single    Spouse name: Not on file   Number of children: Not on file   Years of education: Not on file   Highest education level: Not on file  Occupational History   Not on file  Tobacco Use   Smoking status: Every Day    Current packs/day: 1.00    Types: Cigarettes    Smokeless tobacco: Never  Substance and Sexual Activity   Alcohol use: Never   Drug use: Never   Sexual activity: Not on file  Other Topics Concern   Not on file  Social History Narrative   Not on file   Social Drivers of Health   Financial Resource Strain: Not on file  Food Insecurity: No Food Insecurity (08/03/2023)   Hunger Vital Sign    Worried About Running Out of Food in the Last Year: Never true    Ran Out of Food in the Last Year: Never true  Transportation Needs: No Transportation Needs (08/03/2023)   PRAPARE - Administrator, Civil Service (Medical): No    Lack of Transportation (Non-Medical): No  Physical Activity: Not on file  Stress: Not on file  Social Connections: Not on file     Family History: The patient's family history is not on file.  ROS:   Please see the history of present illness.     All other systems reviewed and are negative.  EKGs/Labs/Other Studies Reviewed:    The following studies were reviewed today:  EKG Interpretation Date/Time:  Wednesday August 21 2023 09:26:36 EDT Ventricular Rate:  90 PR Interval:  150 QRS Duration:  108 QT Interval:  350 QTC Calculation: 428 R Axis:   -14  Text Interpretation: Normal sinus rhythm Incomplete right bundle branch block When compared with ECG of 05-Aug-2023 16:01, Incomplete right bundle branch block is now Present Confirmed by Madie Slough ([id number]) on 08/21/2023 9:27:53 AM    Recent Labs: 08/03/2023: ALT 23; Magnesium 2.0 08/05/2023: BUN 12; Creatinine, Ser 0.94; Hemoglobin 16.4; Platelets 349; Potassium 3.8; Sodium 139  Recent Lipid Panel    Component Value Date/Time   CHOL 192 08/03/2023 1059   TRIG 156 (H) 08/03/2023 1059   HDL 32 (L) 08/03/2023 1059   CHOLHDL 6.0 08/03/2023 1059   VLDL 31 08/03/2023 1059   LDLCALC 129 (H) 08/03/2023 1059     Risk Assessment/Calculations:                Physical Exam:    VS:  BP 121/74   Pulse 95   Ht 5' 6 (1.676 m)   Wt 170 lb (77.1 kg)    SpO2 95%   BMI 27.44 kg/m     Wt Readings from Last 3 Encounters:  08/21/23 170 lb (77.1 kg)  08/05/23 173 lb 1 oz (78.5 kg)     GEN:  Well nourished, well developed in no acute distress HEENT: Normal NECK: No JVD; No carotid bruits LYMPHATICS: No lymphadenopathy CARDIAC:RRR, no murmurs, rubs, gallops RESPIRATORY:  Clear to auscultation without rales, wheezing or rhonchi  ABDOMEN: Soft, non-tender, non-distended MUSCULOSKELETAL:  No edema; No deformity  SKIN: Warm and dry NEUROLOGIC:  Alert and oriented x 3 PSYCHIATRIC:  Normal affect  Right radial C/D/I  ASSESSMENT:  1. Acute ST elevation myocardial infarction (STEMI) of anterolateral wall (HCC)   2. Status post coronary artery stent placement   3. CAD S/P percutaneous coronary angioplasty   4. Ischemic cardiomyopathy   5. Primary hypertension   6. Hyperlipidemia with target low density lipoprotein (LDL) cholesterol less than 55 mg/dL   7. Tobacco abuse    PLAN:    In order of problems listed above:  CAD Anterolateral STEMI 08/03/2023 DES x 2-RCA - Continue DAPT with aspirin  and Brilinta  x 12 months - Continue 80 mg Lipitor -- no bleeding issues   Ischemic cardiomyopathy Hypertension - Echocardiogram with low normal LVEF 50-55% with RWMA - GDMT with 25 mg losartan  -- some SOB, likely from ongoing tobacco abuse -- no LE edema, no orthopnea - Collect BMP today   Hyperlipidemia with LDL less than 55 - Consider lower LDL goal given smoking history -- 08/03/2023: Cholesterol 192; HDL 32; LDL Cholesterol 129; Triglycerides 156; VLDL 31 - Started on 80 mg Lipitor -- LPA 78 - Will need repeat lipid panel and LFTs in 4 weeks -- likely will need PCSK9i   Tobacco abuse - states patches in the hospital worked better that OTC medications - discussed 1-800-quit-now - will hold off on chantix   Disposition - needs PCP - will submit referral   Follow up with Laymon Qua [facility] in [place]    Cardiac  Rehabilitation Eligibility Assessment  The patient is ready to start cardiac rehabilitation from a cardiac standpoint.          Medication Adjustments/Labs and Tests Ordered: Current medicines are reviewed at length with the patient today.  Concerns regarding medicines are outlined above.  Orders Placed This Encounter  Procedures   Basic metabolic panel with GFR   Lipid panel   Hepatic function panel   EKG 12-Lead   Meds ordered this encounter  Medications   metoprolol  succinate (TOPROL  XL) 25 MG 24 hr tablet    Sig: Take 0.5 tablets (12.5 mg total) by mouth at bedtime.    Dispense:  45 tablet    Refill:  1   ticagrelor  (BRILINTA ) 90 MG TABS tablet    Sig: Take 1 tablet (90 mg total) by mouth 2 (two) times daily.    Dispense:  60 tablet    Refill:  0    Patient Instructions  Medication Instructions:  The refill for Janann has been sent to your pharmacy.  Begin Metoprolol  Succinate 12.5mg . Take one tablet at night.  *If you need a refill on your cardiac medications before your next appointment, please call your pharmacy*   Lab Work: Labs will be drawn today................ BMET  ALSO,  return in 4 week for fasting labs....................SABRA LIPID, LFT's If you have labs (blood work) drawn today and your tests are completely normal, you will receive your results only by: MyChart Message (if you have MyChart) OR A paper copy in the mail If you have any lab test that is abnormal or we need to change your treatment, we will call you to review the results.   Testing/Procedures: No procedures were ordered during today's visit.    Follow-Up: At [facility], you and your health needs are our priority.  As part of our continuing mission to provide you with exceptional heart care, we have created designated Provider Care Teams.  These Care Teams include your primary Cardiologist (physician) and Advanced Practice Providers (APPs -  Physician Assistants and Nurse  Practitioners) who all work together to provide you with  the care you need, when you need it.  We recommend signing up for the patient portal called MyChart.  Sign up information is provided on this After Visit Summary.  MyChart is used to connect with patients for Virtual Visits (Telemedicine).  Patients are able to view lab/test results, encounter notes, upcoming appointments, etc.  Non-urgent messages can be sent to your provider as well.   To learn more about what you can do with MyChart, go to [email or web address].        Provider:   Jon Hails, PA-C          Other Instructions Thank you for choosing [facility]!       Signed, Jon Nat Hails, [place]  08/21/2023 9:34 AM    [facility]

## 2023-08-21 ENCOUNTER — Encounter: Payer: Self-pay | Admitting: Physician Assistant

## 2023-08-21 ENCOUNTER — Ambulatory Visit: Attending: Cardiovascular Disease | Admitting: Physician Assistant

## 2023-08-21 VITALS — BP 121/74 | HR 95 | Ht 66.0 in | Wt 170.0 lb

## 2023-08-21 DIAGNOSIS — I2109 ST elevation (STEMI) myocardial infarction involving other coronary artery of anterior wall: Secondary | ICD-10-CM | POA: Diagnosis not present

## 2023-08-21 DIAGNOSIS — Z9861 Coronary angioplasty status: Secondary | ICD-10-CM

## 2023-08-21 DIAGNOSIS — I255 Ischemic cardiomyopathy: Secondary | ICD-10-CM | POA: Diagnosis not present

## 2023-08-21 DIAGNOSIS — E785 Hyperlipidemia, unspecified: Secondary | ICD-10-CM | POA: Insufficient documentation

## 2023-08-21 DIAGNOSIS — I10 Essential (primary) hypertension: Secondary | ICD-10-CM | POA: Diagnosis not present

## 2023-08-21 DIAGNOSIS — I2510 Atherosclerotic heart disease of native coronary artery without angina pectoris: Secondary | ICD-10-CM | POA: Diagnosis not present

## 2023-08-21 DIAGNOSIS — Z955 Presence of coronary angioplasty implant and graft: Secondary | ICD-10-CM

## 2023-08-21 DIAGNOSIS — Z720 Tobacco use: Secondary | ICD-10-CM | POA: Insufficient documentation

## 2023-08-21 LAB — BASIC METABOLIC PANEL WITH GFR
BUN/Creatinine Ratio: 14 (ref 9–20)
BUN: 11 mg/dL (ref 6–24)
CO2: 20 mmol/L (ref 20–29)
Calcium: 9.5 mg/dL (ref 8.7–10.2)
Chloride: 104 mmol/L (ref 96–106)
Creatinine, Ser: 0.77 mg/dL (ref 0.76–1.27)
Glucose: 97 mg/dL (ref 70–99)
Potassium: 4.4 mmol/L (ref 3.5–5.2)
Sodium: 141 mmol/L (ref 134–144)
eGFR: 108 mL/min/1.73 (ref >59)

## 2023-08-21 MED ORDER — METOPROLOL SUCCINATE ER 25 MG PO TB24: 12.5000 mg | ORAL_TABLET | Freq: Every evening | ORAL | 1 refills | Status: DC

## 2023-08-21 MED ORDER — TICAGRELOR 90 MG PO TABS
90.0000 mg | ORAL_TABLET | Freq: Two times a day (BID) | ORAL | 0 refills | Status: DC
Start: 2023-08-21 — End: 2023-10-07

## 2023-08-21 NOTE — Patient Instructions (Addendum)
 Medication Instructions:  The refill for Janann has been sent to your pharmacy.  Begin Metoprolol  Succinate 12.5mg . Take one tablet at night.  *If you need a refill on your cardiac medications before your next appointment, please call your pharmacy*   Lab Work: Labs will be drawn today................ BMET  ALSO,  return in 4 week for fasting labs....................SABRA LIPID, LFT's If you have labs (blood work) drawn today and your tests are completely normal, you will receive your results only by: MyChart Message (if you have MyChart) OR A paper copy in the mail If you have any lab test that is abnormal or we need to change your treatment, we will call you to review the results.   Testing/Procedures: No procedures were ordered during today's visit.    Follow-Up: At [facility], you and your health needs are our priority.  As part of our continuing mission to provide you with exceptional heart care, we have created designated Provider Care Teams.  These Care Teams include your primary Cardiologist (physician) and Advanced Practice Providers (APPs -  Physician Assistants and Nurse Practitioners) who all work together to provide you with the care you need, when you need it.  We recommend signing up for the patient portal called MyChart.  Sign up information is provided on this After Visit Summary.  MyChart is used to connect with patients for Virtual Visits (Telemedicine).  Patients are able to view lab/test results, encounter notes, upcoming appointments, etc.  Non-urgent messages can be sent to your provider as well.   To learn more about what you can do with MyChart, go to [email or web address].        Provider:   Jon Hails, PA-C          Other Instructions Thank you for choosing [facility]!

## 2023-08-22 ENCOUNTER — Ambulatory Visit: Payer: Self-pay | Admitting: Physician Assistant

## 2023-09-04 ENCOUNTER — Other Ambulatory Visit (HOSPITAL_COMMUNITY): Payer: Self-pay

## 2023-09-09 ENCOUNTER — Other Ambulatory Visit: Payer: Self-pay

## 2023-09-09 ENCOUNTER — Telehealth: Payer: Self-pay | Admitting: Cardiovascular Disease

## 2023-09-09 MED ORDER — NITROGLYCERIN 0.4 MG SL SUBL
0.4000 mg | SUBLINGUAL_TABLET | SUBLINGUAL | 3 refills | Status: AC | PRN
Start: 2023-09-09 — End: ?

## 2023-09-09 NOTE — Telephone Encounter (Signed)
 RX sent in

## 2023-09-09 NOTE — Telephone Encounter (Signed)
*  STAT* If patient is at the pharmacy, call can be transferred to refill team.   1. Which medications need to be refilled? (please list name of each medication and dose if known) nitroGLYCERIN  (NITROSTAT ) 0.4 MG SL tablet    2. Would you like to learn more about the convenience, safety, & potential cost savings by using the [facility]?    3. Are you open to using the [facility] (Type [facility].  ).   4. Which pharmacy/location (including street and city if local pharmacy) is medication to be sent to? [facility] - [place], [place] - [address]    5. Do they need a 30 day or 90 day supply? 90 day

## 2023-09-10 ENCOUNTER — Other Ambulatory Visit (HOSPITAL_COMMUNITY): Payer: Self-pay

## 2023-10-01 ENCOUNTER — Other Ambulatory Visit: Payer: Self-pay

## 2023-10-02 MED ORDER — ATORVASTATIN CALCIUM 80 MG PO TABS: 80.0000 mg | ORAL_TABLET | Freq: Every day | ORAL | 3 refills | Status: DC

## 2023-10-02 MED ORDER — LOSARTAN POTASSIUM 25 MG PO TABS: 25.0000 mg | ORAL_TABLET | Freq: Every day | ORAL | 3 refills | Status: DC

## 2023-10-07 ENCOUNTER — Telehealth: Payer: Self-pay | Admitting: Cardiovascular Disease

## 2023-10-07 MED ORDER — TICAGRELOR 90 MG PO TABS: 90.0000 mg | ORAL_TABLET | Freq: Two times a day (BID) | ORAL | 3 refills | Status: DC

## 2023-10-07 MED ORDER — METOPROLOL SUCCINATE ER 25 MG PO TB24: 12.5000 mg | ORAL_TABLET | Freq: Every evening | ORAL | 3 refills | Status: DC

## 2023-10-07 NOTE — Telephone Encounter (Signed)
 RX sent in

## 2023-10-07 NOTE — Telephone Encounter (Signed)
*  STAT* If patient is at the pharmacy, call can be transferred to refill team.   1. Which medications need to be refilled? (please list name of each medication and dose if known) metoprolol  succinate (TOPROL  XL) 25 MG 24 hr tablet  ticagrelor  (BRILINTA ) 90 MG TABS tablet    4. Which pharmacy/location (including street and city if local pharmacy) is medication to be sent to?  [facility] - [place], [place] - [address]     5. Do they need a 30 day or 90 day supply? 90

## 2023-10-16 ENCOUNTER — Encounter: Payer: Self-pay | Admitting: Student

## 2023-10-16 ENCOUNTER — Ambulatory Visit: Attending: Student | Admitting: Student

## 2023-10-16 VITALS — BP 140/78 | HR 88 | Ht 66.0 in | Wt 180.6 lb

## 2023-10-16 DIAGNOSIS — E785 Hyperlipidemia, unspecified: Secondary | ICD-10-CM | POA: Diagnosis not present

## 2023-10-16 DIAGNOSIS — Z720 Tobacco use: Secondary | ICD-10-CM

## 2023-10-16 DIAGNOSIS — I10 Essential (primary) hypertension: Secondary | ICD-10-CM

## 2023-10-16 DIAGNOSIS — I2510 Atherosclerotic heart disease of native coronary artery without angina pectoris: Secondary | ICD-10-CM | POA: Diagnosis not present

## 2023-10-16 MED ORDER — CHANTIX STARTING MONTH PAK 0.5 MG X 11 & 1 MG X 42 PO TBPK: ORAL_TABLET | ORAL | 0 refills | Status: DC

## 2023-10-16 MED ORDER — TICAGRELOR 90 MG PO TABS: 90.0000 mg | ORAL_TABLET | Freq: Two times a day (BID) | ORAL | 3 refills | Status: AC

## 2023-10-16 MED ORDER — ATORVASTATIN CALCIUM 80 MG PO TABS: 80.0000 mg | ORAL_TABLET | Freq: Every day | ORAL | 3 refills | Status: AC

## 2023-10-16 MED ORDER — LOSARTAN POTASSIUM 25 MG PO TABS: 25.0000 mg | ORAL_TABLET | Freq: Every day | ORAL | 3 refills | Status: AC

## 2023-10-16 MED ORDER — METOPROLOL SUCCINATE ER 25 MG PO TB24: 25.0000 mg | ORAL_TABLET | Freq: Every evening | ORAL | 3 refills | Status: AC

## 2023-10-16 NOTE — Progress Notes (Signed)
 Cardiology Office Note    Date:  10/16/2023  ID:  Tanner Mcmillan, DOB 03/13/1970, MRN [id number] Cardiologist: Lonni Cash, MD --> patient has requested to follow-up in [facility]  History of Present Illness:    Tanner Mcmillan is a 53 y.o. male with past medical history of CAD (s/p STEMI in 07/2023 with DES x 2 to RCA and staged PCI with DESx2 to D1 later that admission), ischemic cardiomyopathy (EF 50 to 55% by echocardiogram in 07/2023), HTN, HLD and tobacco use who presents to the office today for 10-month follow-up.  He was last examined by Mercy Hails, PA in 07/2021 following his recent hospitalization and denied any recent anginal symptoms at that time. He was continued on ASA 81 mg daily, Atorvastatin  80 mg daily, Losartan  25 mg daily, Toprol -XL 12.5 mg daily and Brilinta  90 mg twice daily. It was recommended to obtain a repeat FLP and LFT's in 4 weeks but these have not yet been obtained.  In talking with the patient today, he denies any chest pain resembling his prior MI but does report having occasional odd sensations along his left pectoral region which last for a few seconds and resolve. Can occur at rest or with activity. He still feels fatigued since his prior MI and this has not changed in frequency or severity. He was having difficulty obtaining his medications last week due to changes in insurance and was without Losartan , Toprol -XL and Brilinta  for several days. He has since restarted these. Has been without Atorvastatin  though as he reports his insurance will not currently cover this and it needs to be sent to a different pharmacy. He is still smoking approximately 1 pack/day. Interested in cessation options. Previously tried nicotine  patches without success.   Studies Reviewed:   EKG: EKG is not ordered today.    Cardiac Catheterization: 07/2023   Mid RCA lesion is 99% stenosed.   Dist RCA lesion is 70% stenosed.   1st Mrg lesion is 40% stenosed.   Mid LAD  lesion is 70% stenosed.   1st Diag lesion is 50% stenosed.   A drug-eluting stent was successfully placed using a STENT SYNERGY XD 3.0X24.   A drug-eluting stent was successfully placed using a STENT SYNERGY XD [id number].   Post intervention, there is a 0% residual stenosis.   Post intervention, there is a 0% residual stenosis.   Acute anterolateral STEMI Successful PTCA/DES x 2 mid and distal RCA Severe stenosis mid LAD (70% eccentric).  Segmental LV dysfunction with anteroapical hypokinesis.    Recommendations: Will admit to the ICU. Aggrastat  infusion for 2 hours post cath. DAPT with ASA and Brilinta  for one year. High intensity statin. Echo this weekend. Will plan staged PCI of the mid LAD on Monday.    Coronary Stent Intervention: 07/2023 Conclusions: Significant multifocal D1 disease; inferior branch appears to have been occluded on prior angiogram with spontaneous recanalization and 90% residual stenosis.  Superior branch has 70% stenosis that is hemodynamically significant (RFR = 0.88. Moderate LAD disease that is not hemodynamically significant (RFR = 0.91). Normal left ventricular filling pressure (LVEDP 10 mmHg). Successful PCI to superior and inferior branches of D1 using Synergy 2.25 x 20 mm (superior branch) and Onyx Frontier 2.0 x 18 mm (inferior branch) with 0% residual stenosis and TIMI-3 flow.   Recommendations: Continue dual antiplatelet therapy with aspirin  and ticagrelor  for at least 12 months. Aggressive secondary prevention. Anticipate discharge this evening if no post-PCI complications occur during 4 hours of post-PCI  monitoring.    Echocardiogram: 07/2023 IMPRESSIONS     1. Left ventricular ejection fraction, by estimation, is 50 to 55%. Left  ventricular ejection fraction by 3D volume is 50 %. The left ventricle has  low normal function. The left ventricle demonstrates regional wall motion  abnormalities (see scoring  diagram/findings for description). There  is moderate concentric left  ventricular hypertrophy. Left ventricular diastolic parameters are  indeterminate.   2. Right ventricular systolic function is normal. The right ventricular  size is normal. Tricuspid regurgitation signal is inadequate for assessing  PA pressure.   3. The mitral valve is grossly normal. Trivial mitral valve  regurgitation.   4. The aortic valve is tricuspid. Aortic valve regurgitation is not  visualized.   5. The inferior vena cava is normal in size with greater than 50%  respiratory variability, suggesting right atrial pressure of 3 mmHg.   Comparison(s): No prior Echocardiogram.    Risk Assessment/Calculations:    HYPERTENSION CONTROL Vitals:   10/16/23 1507 10/16/23 1518 10/16/23 1559  BP: (!) 148/90 (!) 148/90 (!) 140/78    The patient's blood pressure is elevated above target today. In order to address the patient's elevated BP: A current anti-hypertensive medication was adjusted today.        Physical Exam:   VS:  BP (!) 140/78   Pulse 88   Ht 5' 6 (1.676 m)   Wt 180 lb 9.6 oz (81.9 kg)   SpO2 97%   BMI 29.15 kg/m    Wt Readings from Last 3 Encounters:  10/16/23 180 lb 9.6 oz (81.9 kg)  08/21/23 170 lb (77.1 kg)  08/05/23 173 lb 1 oz (78.5 kg)     GEN: Well nourished, well developed male appearing in no acute distress NECK: No JVD; No carotid bruits CARDIAC: RRR, no murmurs, rubs, gallops RESPIRATORY:  Clear to auscultation without rales, wheezing or rhonchi  ABDOMEN: Appears non-distended. No obvious abdominal masses. EXTREMITIES: No clubbing or cyanosis. No pitting edema.  Distal pedal pulses are 2+ bilaterally.   Assessment and Plan:   1. Coronary artery disease involving native coronary artery of native heart without angina pectoris - He is s/p STEMI in 07/2023 with DES x 2 to RCA and staged PCI with DESx2 to D1 and EF at 50 to 55%. The importance of compliance with DAPT was reviewed and will continue ASA 81 mg daily and  Brilinta  90 mg twice daily. Will restart Atorvastatin  80 mg daily and titrate Toprol -XL to 25 mg daily.   - He reports still having fatigue (no change when he was without Toprol -XL) and does not feel like his energy level has returned to baseline following his MI. Was previously having to lift heavy objects at work and will write a note to continue with light duty (to not lift more than 40 pounds) until his next follow-up visit in 3 months.    2. Essential hypertension - BP was elevated during multiple checks today and at 140/78 when personally rechecked. He did resume Losartan  and Toprol -XL last week. Will continue Losartan  25 mg daily and titrate Toprol -XL to 25 mg daily. Was encouraged to follow BP in the ambulatory setting. Can further titrate Losartan  pending BP trend.  3. Hyperlipidemia LDL goal <55 - LDL was elevated at 129 in 07/2023 and LPa was at 78. Was initially planning to recheck an FLP and LFT's but he has been without Atorvastatin  for several weeks. New Rx provided for Atorvastatin  80 mg daily. Will plan to recheck  an FLP and LFT's in 2-3 months following compliance with medication.  4. Tobacco use - He is still smoking approximately 1 pack/day. Previously tried nicotine  replacement and unable to consistently reduce use. Reviewed options and he wants to try Chantix . Will send this to his pharmacy.  Signed, Laymon CHRISTELLA Qua, PA-C

## 2023-10-16 NOTE — Patient Instructions (Signed)
 Medication Instructions:  Your physician has recommended you make the following change in your medication:   Increase Toprol  XL to 25 mg Daily   *If you need a refill on your cardiac medications before your next appointment, please call your pharmacy*  Lab Work: NONE   If you have labs (blood work) drawn today and your tests are completely normal, you will receive your results only by: MyChart Message (if you have MyChart) OR A paper copy in the mail If you have any lab test that is abnormal or we need to change your treatment, we will call you to review the results.  Testing/Procedures: NONE   Follow-Up: At [facility], you and your health needs are our priority.  As part of our continuing mission to provide you with exceptional heart care, our providers are all part of one team.  This team includes your primary Cardiologist (physician) and Advanced Practice Providers or APPs (Physician Assistants and Nurse Practitioners) who all work together to provide you with the care you need, when you need it.  Your next appointment:   3 month(s)  Provider:   Vishnu Mallipeddi, MD or Laymon Qua, PA-C    We recommend signing up for the patient portal called MyChart.  Sign up information is provided on this After Visit Summary.  MyChart is used to connect with patients for Virtual Visits (Telemedicine).  Patients are able to view lab/test results, encounter notes, upcoming appointments, etc.  Non-urgent messages can be sent to your provider as well.   To learn more about what you can do with MyChart, go to [email or web address].   Other Instructions Thank you for choosing [facility]!

## 2023-10-18 ENCOUNTER — Other Ambulatory Visit: Payer: Self-pay | Admitting: Student

## 2023-10-23 ENCOUNTER — Telehealth: Admitting: Student

## 2023-10-23 NOTE — Telephone Encounter (Signed)
 Patient has not been taking medication for the last 3-4 weeks due to cost increasing from $0 to $55. Patient stated that he cannot afford medication.   Please advise if we can help with this.

## 2023-10-23 NOTE — Telephone Encounter (Signed)
 Pt c/o medication issue:  1. Name of Medication: ticagrelor  (BRILINTA ) 90 MG TABS tablet   2. How are you currently taking this medication (dosage and times per day)?  Take 1 tablet (90 mg total) by mouth 2 (two) times daily.     3. Are you having a reaction (difficulty breathing--STAT)? No  4. What is your medication issue? Pt states that he was called by his pharmacy ([facility] - [place], [place] - [address] [facility] [address] [facility]) stating that our office need to contact them before sending out his medication. Number to call is [phone]. Pt would also like to be called once this matter has been taken care of. Please advise

## 2023-10-24 ENCOUNTER — Other Ambulatory Visit (HOSPITAL_COMMUNITY): Payer: Self-pay

## 2023-10-24 NOTE — Telephone Encounter (Signed)
 Spoke to patient and offered Brilinta  savings card since pt is a commercially insured patient. Patient agreed and will have daughter pick up card from [place] office. Pt had no questions or concerns at this time.

## 2023-10-24 NOTE — Telephone Encounter (Signed)
 Commercially insured patient not eligible for PAP. The Brilinta  PAP program has also been discontinued so there is no longer any drug assistance available for this drug.

## 2023-10-31 ENCOUNTER — Telehealth: Payer: Self-pay | Admitting: Student

## 2023-10-31 DIAGNOSIS — R457 State of emotional shock and stress, unspecified: Secondary | ICD-10-CM | POA: Diagnosis not present

## 2023-10-31 NOTE — Telephone Encounter (Signed)
 Pt states he had to call EMS for chest pains earlier was told to contact his cardiologist to have blood work done.

## 2023-10-31 NOTE — Telephone Encounter (Signed)
 Spoke to patient who verbalized understanding. Patient will continue to monitor bp and will send a mychart message on Monday 10/6 with readings Pt feels like he does not need to go to ED at this time.

## 2023-10-31 NOTE — Telephone Encounter (Signed)
 Spoke to pt who stated that while at work today, pt started experiencing cp. 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 PT stated EMS tech told pt that he needed to contact his cardiologist office to have lab work drawn to tell if he had a MI, as the tech could not tell per EKG. Pt stated they checked bp: 152/88 and hr- 96. Pt stated that he was not having any burning feeling like before. Pt stated that he got upset this morning and was unsure if this brought the episode on. Pt stated that he felt fine now.  Please advise.

## 2023-10-31 NOTE — Telephone Encounter (Signed)
   We do not routinely check troponin values from an outpatient perspective as they have to be checked multiple times and cycled over the course of a few hours. If he is still having episodes of chest pain, would advise ED evaluation. If no recurrent pain, he does not need to proceed to the ED at this time. Please review the role of SL NTG x3 and if taking more than 3, then proceeding to the ED. Emotional stress can cause pain as well and could have caused his elevated blood pressure. Would encourage him to continue to check blood pressures and may need to increase Losartan  to help with this.  Signed, Laymon CHRISTELLA Qua, PA-C 10/31/2023, 3:41 PM Pager: [phone]

## 2023-11-18 ENCOUNTER — Telehealth: Payer: Self-pay | Admitting: Cardiovascular Disease

## 2023-11-18 MED ORDER — CHANTIX STARTING MONTH PAK 0.5 MG X 11 & 1 MG X 42 PO TBPK: ORAL_TABLET | ORAL | 0 refills | Status: DC

## 2023-11-18 NOTE — Telephone Encounter (Signed)
*  STAT* If patient is at the pharmacy, call can be transferred to refill team.   1. Which medications need to be refilled? (please list name of each medication and dose if known) CHANTIX  STARTING MONTH PAK 0.5 MG X 11 & 1 MG X 42 TBPK   2. Which pharmacy/location (including street and city if local pharmacy) is medication to be sent to? [facility] - [address] [place], [place] [address]  3. Do they need a 30 day or 90 day supply?  90 day supply  Patient says [facility] informed him that they are unable to mail this medication and it needs to go to a local pharmacy. Patient would like to have Rx transferred.

## 2023-11-22 ENCOUNTER — Other Ambulatory Visit: Payer: Self-pay

## 2023-11-26 MED ORDER — CHANTIX STARTING MONTH PAK 0.5 MG X 11 & 1 MG X 42 PO TBPK: ORAL_TABLET | ORAL | 0 refills | Status: DC

## 2023-11-26 NOTE — Addendum Note (Signed)
 Addended by: BLUFORD RAMP D on: 11/26/2023 11:00 AM   Modules accepted: Orders

## 2023-11-26 NOTE — Telephone Encounter (Signed)
 Pt requesting that his medication chantix  be resent to a local pharmacy [facility]. Medication resent. Confirmation received.

## 2023-12-31 ENCOUNTER — Telehealth: Payer: Self-pay | Admitting: Student

## 2023-12-31 NOTE — Telephone Encounter (Signed)
*  STAT* If patient is at the pharmacy, call can be transferred to refill team.   1. Which medications need to be refilled? (please list name of each medication and dose if known) CHANTIX  STARTING MONTH PAK 0.5 MG X 11 & 1 MG X 42 TBPK   4. Which pharmacy/location (including street and city if local pharmacy) is medication to be sent to?  [id number] - [place], [place] - [address]     5. Do they need a 30 day or 90 day supply? 90

## 2024-01-01 MED ORDER — VARENICLINE TARTRATE 1 MG PO TABS: 1.0000 mg | ORAL_TABLET | Freq: Two times a day (BID) | ORAL | 1 refills | Status: AC

## 2024-01-01 NOTE — Telephone Encounter (Signed)
 Per Laymon Qua, PA-C, Can provide refills. Just needs to be updated to the Chantix  continuing month pack instead of the starter pack.   Thanks,  Brittany   will change prescription to Chantix  1 mg bid.  Refill sent.

## 2024-01-13 NOTE — Progress Notes (Unsigned)
 Cardiology Office Note    Date:  01/15/2024  ID:  CHAO BLAZEJEWSKI, DOB Aug 01, 1970, MRN [id number] Cardiologist: Lonni Cash, MD --> patient requested to follow-up in [facility] (will switch to Dr. Stacia)  History of Present Illness:    Tanner Mcmillan is a 53 y.o. male with past medical history of CAD (s/p STEMI in 07/2023 with DES x 2 to RCA and staged PCI with DESx2 to D1 later that admission), ischemic cardiomyopathy (EF 50 to 55% by echocardiogram in 07/2023), HTN, HLD and tobacco use who presents to the office today for 27-month follow-up.  He was examined by myself in 09/2023 and reported occasional odd sensations along his left pectoral region which would only last for a few seconds but nothing resembling his prior angina. He had been without some of his cardiac medications due to issues obtaining these from his pharmacy but had recently restarted the medications. BP was elevated and was recommended to titrate Toprol  to 25 mg daily while continuing Atorvastatin  80 mg daily, ASA 81 mg daily, Brilinta  90 mg twice daily and Losartan  25 mg daily. If BP remained above goal, was recommended to further titrate Losartan .  He had previously tried nicotine  replacement without success and wanted to try Chantix .  In talking with the patient today, he reports things have overall been stable since his last visit. He still reports having fatigue which is most notable at the end of the day. 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 Reports occasional, brief episodes of chest pain which typically last for a few seconds and then resolve. No persistent symptoms and he has not had to utilize SL NTG. Reports good compliance with his current medical therapy including ASA and Brilinta . No recent orthopnea, PND or pitting edema. He is still smoking approximately 1 pack/day.  Studies Reviewed:   EKG: EKG is  not ordered today.  Cardiac Catheterization: 07/2023   Mid RCA lesion is 99% stenosed.   Dist RCA lesion is 70% stenosed.   1st Mrg lesion is 40% stenosed.   Mid LAD lesion is 70% stenosed.   1st Diag lesion is 50% stenosed.   A drug-eluting stent was successfully placed using a STENT SYNERGY XD 3.0X24.   A drug-eluting stent was successfully placed using a STENT SYNERGY XD [id number].   Post intervention, there is a 0% residual stenosis.   Post intervention, there is a 0% residual stenosis.   Acute anterolateral STEMI Successful PTCA/DES x 2 mid and distal RCA Severe stenosis mid LAD (70% eccentric).  Segmental LV dysfunction with anteroapical hypokinesis.    Recommendations: Will admit to the ICU. Aggrastat  infusion for 2 hours post cath. DAPT with ASA and Brilinta  for one year. High intensity statin. Echo this weekend. Will plan staged PCI of the mid LAD on Monday.     Coronary Stent Intervention: 07/2023 Conclusions: Significant multifocal D1 disease; inferior branch appears to have been occluded on prior angiogram with spontaneous recanalization and 90% residual stenosis.  Superior branch has 70% stenosis that is hemodynamically significant (RFR = 0.88. Moderate LAD disease that is not hemodynamically significant (RFR = 0.91). Normal left ventricular filling pressure (LVEDP 10 mmHg). Successful PCI to superior and inferior branches of D1 using Synergy 2.25 x 20 mm (superior branch) and Onyx Frontier 2.0 x 18 mm (inferior branch) with 0% residual stenosis and TIMI-3 flow.   Recommendations: Continue dual antiplatelet  therapy with aspirin  and ticagrelor  for at least 12 months. Aggressive secondary prevention. Anticipate discharge this evening if no post-PCI complications occur during 4 hours of post-PCI monitoring.     Echocardiogram: 07/2023 IMPRESSIONS     1. Left ventricular ejection fraction, by estimation, is 50 to 55%. Left  ventricular ejection fraction by 3D volume is  50 %. The left ventricle has  low normal function. The left ventricle demonstrates regional wall motion  abnormalities (see scoring  diagram/findings for description). There is moderate concentric left  ventricular hypertrophy. Left ventricular diastolic parameters are  indeterminate.   2. Right ventricular systolic function is normal. The right ventricular  size is normal. Tricuspid regurgitation signal is inadequate for assessing  PA pressure.   3. The mitral valve is grossly normal. Trivial mitral valve  regurgitation.   4. The aortic valve is tricuspid. Aortic valve regurgitation is not  visualized.   5. The inferior vena cava is normal in size with greater than 50%  respiratory variability, suggesting right atrial pressure of 3 mmHg.   Comparison(s): No prior Echocardiogram.    Physical Exam:   VS:  BP 130/80 (BP Location: Left Arm, Cuff Size: Normal)   Pulse 79   Ht 5' 6 (1.676 m)   Wt 185 lb (83.9 kg)   SpO2 96%   BMI 29.86 kg/m    Wt Readings from Last 3 Encounters:  01/15/24 185 lb (83.9 kg)  10/16/23 180 lb 9.6 oz (81.9 kg)  08/21/23 170 lb (77.1 kg)     GEN: Pleasant male appearing in no acute distress NECK: No JVD; No carotid bruits CARDIAC: RRR, no murmurs, rubs, gallops RESPIRATORY:  Clear to auscultation without rales, wheezing or rhonchi  ABDOMEN: Appears non-distended. No obvious abdominal masses. EXTREMITIES: No clubbing or cyanosis. No pitting edema.  Distal pedal pulses are 2+ bilaterally.   Assessment and Plan:   1. Coronary artery disease involving native coronary artery of native heart without angina pectoris - He previously had a STEMI in 07/2023 with DES x 2 to the RCA and staged PCI with DES x 2 to D1. He reports occasional, brief episodes of chest pain which only last for a few seconds and overall seem atypical for angina. Reviewed the importance of obtaining adequate aerobic activity. He will continue on light duty at work and a letter was  provided for him to not lift more than 40 pounds at this time. - Continue current medical therapy with ASA 81 mg daily, Atorvastatin  80 mg daily, Toprol -XL 25 mg daily and Brilinta  90 mg twice daily. We reviewed that he could try reducing Toprol -XL to 12.5 mg daily for a few days to see if this helps with his fatigue. If no improvement, would resume at his current dose.  2. Hyperlipidemia with target low density lipoprotein (LDL) cholesterol less than 55 mg/dL - LDL was elevated at 870 in 07/2023. He has consistently been on Atorvastatin  80 mg daily and will obtain a follow-up FLP and LFT's. He does consume takeout from fast food restaurants several times per week and we reviewed the importance of limiting processed foods.  3. Essential hypertension - Blood pressure is at 130/80 during today's visit. Continue current medical therapy Losartan  25 mg daily and Toprol -XL 25 mg daily.  4. Tobacco use - He is still smoking approximately 1 pack/day. He has been on Chantix  and we reviewed the importance of picking a stop date. If unable to stop within the next 1 to 2 months, would stop  Chantix . Previously used nicotine  replacement without success.   Signed, Laymon CHRISTELLA Qua, PA-C

## 2024-01-15 ENCOUNTER — Ambulatory Visit: Attending: Student | Admitting: Student

## 2024-01-15 ENCOUNTER — Encounter: Payer: Self-pay | Admitting: Student

## 2024-01-15 VITALS — BP 130/80 | HR 79 | Ht 66.0 in | Wt 185.0 lb

## 2024-01-15 DIAGNOSIS — I10 Essential (primary) hypertension: Secondary | ICD-10-CM | POA: Diagnosis not present

## 2024-01-15 DIAGNOSIS — Z720 Tobacco use: Secondary | ICD-10-CM

## 2024-01-15 DIAGNOSIS — E785 Hyperlipidemia, unspecified: Secondary | ICD-10-CM

## 2024-01-15 DIAGNOSIS — I2510 Atherosclerotic heart disease of native coronary artery without angina pectoris: Secondary | ICD-10-CM

## 2024-01-15 NOTE — Patient Instructions (Addendum)
 Try taking Toprol -XL 12.5mg  daily to see if this helps with fatigue.   Medication Instructions:  Your physician recommends that you continue on your current medications as directed. Please refer to the Current Medication list given to you today.  *If you need a refill on your cardiac medications before your next appointment, please call your pharmacy*  Lab Work: Your physician recommends that you return for lab work in: Fasting   Please have this done at [facility] (hours-Monday through Friday from 8:00 am to 4:00 pm except 11:30 am to 12:10 pm)  If you have labs (blood work) drawn today and your tests are completely normal, you will receive your results only by: MyChart Message (if you have MyChart) OR A paper copy in the mail If you have any lab test that is abnormal or we need to change your treatment, we will call you to review the results.  Testing/Procedures: NONE   Follow-Up: At [facility], you and your health needs are our priority.  As part of our continuing mission to provide you with exceptional heart care, our providers are all part of one team.  This team includes your primary Cardiologist (physician) and Advanced Practice Providers or APPs (Physician Assistants and Nurse Practitioners) who all work together to provide you with the care you need, when you need it.  Your next appointment:   5-6 month(s)  Provider:   Vishnu Mallipeddi, MD or Laymon Qua, PA-C    We recommend signing up for the patient portal called MyChart.  Sign up information is provided on this After Visit Summary.  MyChart is used to connect with patients for Virtual Visits (Telemedicine).  Patients are able to view lab/test results, encounter notes, upcoming appointments, etc.  Non-urgent messages can be sent to your provider as well.   To learn more about what you can do with MyChart, go to [email or web address].   Other Instructions Thank you for choosing [facility]!

## 2024-02-26 ENCOUNTER — Emergency Department (HOSPITAL_COMMUNITY)

## 2024-02-26 ENCOUNTER — Encounter (HOSPITAL_COMMUNITY): Payer: Self-pay

## 2024-02-26 ENCOUNTER — Other Ambulatory Visit: Payer: Self-pay

## 2024-02-26 ENCOUNTER — Emergency Department (HOSPITAL_COMMUNITY)
Admission: EM | Admit: 2024-02-26 | Discharge: 2024-02-27 | Discharge status: Against Medical Advice | Attending: Student | Admitting: Student

## 2024-02-26 DIAGNOSIS — R0602 Shortness of breath: Secondary | ICD-10-CM | POA: Insufficient documentation

## 2024-02-26 DIAGNOSIS — R079 Chest pain, unspecified: Secondary | ICD-10-CM | POA: Insufficient documentation

## 2024-02-26 DIAGNOSIS — Z5321 Procedure and treatment not carried out due to patient leaving prior to being seen by health care provider: Secondary | ICD-10-CM | POA: Insufficient documentation

## 2024-02-26 LAB — CBC
HCT: 44.8 % (ref 39.0–52.0)
Hemoglobin: 14.8 g/dL (ref 13.0–17.0)
MCH: 32 pg (ref 26.0–34.0)
MCHC: 33 g/dL (ref 30.0–36.0)
MCV: 96.8 fL (ref 80.0–100.0)
Platelets: 346 10*3/uL (ref 150–400)
RBC: 4.63 MIL/uL (ref 4.22–5.81)
RDW: 12.8 % (ref 11.5–15.5)
WBC: 13.4 10*3/uL — ABNORMAL HIGH (ref 4.0–10.5)
nRBC: 0 % (ref 0.0–0.2)

## 2024-02-26 NOTE — ED Provider Triage Note (Signed)
 Emergency Medicine Provider Triage Evaluation Note  Tanner Mcmillan , a 54 y.o. male  was evaluated in triage.  Pt complains of intermittent left-sided chest pain that began earlier today.  Patient with history of MI in July.  States he has been compliant with medications other than 1 or 2 missed doses over the past 2 weeks.  Endorses shortness of breath but states this has been baseline for him  since the heart attack.  Patient also endorses some left-sided arm pain.  Denies nausea, vomiting.  The patient does state that he had some sort of scare earlier today and questions if this may be related to anxiety  Review of Systems  Positive:  Negative:   Physical Exam  BP (!) 146/82   Pulse 93   Temp 97.9 F (36.6 C)   Resp 16   Ht 5' 6 (1.676 m)   Wt 81.6 kg   SpO2 96%   BMI 29.05 kg/m  Gen:   Awake, no distress   Resp:  Normal effort  MSK:   Moves extremities without difficulty  Other:    Medical Decision Making  Medically screening exam initiated at 11:18 PM.  Appropriate orders placed.  HERMANN DOTTAVIO was informed that the remainder of the evaluation will be completed by another provider, this initial triage assessment does not replace that evaluation, and the importance of remaining in the ED until their evaluation is complete.     Logan Ubaldo NOVAK, PA-C 02/26/24 [id number]

## 2024-02-26 NOTE — ED Triage Notes (Signed)
 Intermittedt left sided chest pains that manifested today around noon driving [place] highway.   Concern for heart issues as he had a heart attack in July.   Pain rated 3/10. Improved after 1 SL n.itro.

## 2024-02-26 NOTE — ED Triage Notes (Signed)
 The pt is c/o chest pain all day with some shortness of breath  mi in July he's on blood thinners

## 2024-02-27 LAB — BASIC METABOLIC PANEL WITH GFR
Anion gap: 14 (ref 5–15)
BUN: 12 mg/dL (ref 6–20)
CO2: 25 mmol/L (ref 22–32)
Calcium: 9 mg/dL (ref 8.9–10.3)
Chloride: 103 mmol/L (ref 98–111)
Creatinine, Ser: 0.94 mg/dL (ref 0.61–1.24)
GFR, Estimated: >60 mL/min
Glucose, Bld: 100 mg/dL — ABNORMAL HIGH (ref 70–99)
Potassium: 4 mmol/L (ref 3.5–5.1)
Sodium: 141 mmol/L (ref 135–145)

## 2024-02-27 LAB — TROPONIN T, HIGH SENSITIVITY
Troponin T High Sensitivity: 9 ng/L (ref 0–19)
Troponin T High Sensitivity: 9 ng/L (ref 0–19)

## 2024-02-27 NOTE — ED Notes (Signed)
 Pt called for vitals x2 with no answer

## 2024-06-17 ENCOUNTER — Ambulatory Visit: Admitting: Student

## 2024-06-24 ENCOUNTER — Ambulatory Visit: Admitting: Student
